# Patient Record
Sex: Male | Born: 1985 | Race: White | Hispanic: No | Marital: Married | Smoking: Current every day smoker
Health system: Southern US, Community
[De-identification: ages and names within clinical notes are randomized; demographics above are authoritative.]

## PROBLEM LIST (undated history)

## (undated) DIAGNOSIS — N200 Calculus of kidney: Secondary | ICD-10-CM

## (undated) HISTORY — PX: OTHER SURGICAL HISTORY: SHX169

---

## 2005-09-08 ENCOUNTER — Emergency Department: Payer: Self-pay | Admitting: Internal Medicine

## 2012-01-09 ENCOUNTER — Emergency Department: Payer: Self-pay | Admitting: Emergency Medicine

## 2012-01-09 LAB — CBC
HGB: 17 g/dL (ref 13.0–18.0)
MCHC: 33.5 g/dL (ref 32.0–36.0)
MCV: 86 fL (ref 80–100)
Platelet: 258 10*3/uL (ref 150–440)
RBC: 5.91 10*6/uL — ABNORMAL HIGH (ref 4.40–5.90)
RDW: 13.4 % (ref 11.5–14.5)
WBC: 7.8 10*3/uL (ref 3.8–10.6)

## 2012-01-09 LAB — COMPREHENSIVE METABOLIC PANEL
Calcium, Total: 9.7 mg/dL (ref 8.5–10.1)
Co2: 28 mmol/L (ref 21–32)
EGFR (African American): >60
EGFR (Non-African Amer.): >60
Glucose: 67 mg/dL (ref 65–99)
SGOT(AST): 12 U/L — ABNORMAL LOW (ref 15–37)
SGPT (ALT): 23 U/L (ref 12–78)

## 2012-01-13 ENCOUNTER — Emergency Department: Payer: Self-pay | Admitting: Emergency Medicine

## 2012-01-13 LAB — BASIC METABOLIC PANEL
Anion Gap: 6 — ABNORMAL LOW (ref 7–16)
BUN: 12 mg/dL (ref 7–18)
Calcium, Total: 8.9 mg/dL (ref 8.5–10.1)
Chloride: 105 mmol/L (ref 98–107)
Creatinine: 1.13 mg/dL (ref 0.60–1.30)
EGFR (African American): >60
Sodium: 138 mmol/L (ref 136–145)

## 2012-01-13 LAB — CBC WITH DIFFERENTIAL/PLATELET
Basophil #: 0.1 10*3/uL (ref 0.0–0.1)
Eosinophil #: 0.4 10*3/uL (ref 0.0–0.7)
HCT: 45.9 % (ref 40.0–52.0)
Lymphocyte %: 32.9 %
MCHC: 33.8 g/dL (ref 32.0–36.0)
Neutrophil %: 54.8 %
Platelet: 248 10*3/uL (ref 150–440)
RBC: 5.34 10*6/uL (ref 4.40–5.90)
RDW: 13.6 % (ref 11.5–14.5)
WBC: 8.2 10*3/uL (ref 3.8–10.6)

## 2012-01-15 LAB — CULTURE, BLOOD (SINGLE)

## 2016-02-22 ENCOUNTER — Encounter: Payer: Self-pay | Admitting: Medical Oncology

## 2016-02-22 ENCOUNTER — Emergency Department: Payer: Self-pay

## 2016-02-22 ENCOUNTER — Emergency Department
Admission: EM | Admit: 2016-02-22 | Discharge: 2016-02-22 | Disposition: A | Discharge status: Home / Self Care | Payer: Self-pay | Attending: Emergency Medicine | Admitting: Emergency Medicine

## 2016-02-22 ENCOUNTER — Other Ambulatory Visit: Payer: Self-pay

## 2016-02-22 DIAGNOSIS — N210 Calculus in bladder: Secondary | ICD-10-CM | POA: Insufficient documentation

## 2016-02-22 DIAGNOSIS — N200 Calculus of kidney: Secondary | ICD-10-CM

## 2016-02-22 LAB — URINALYSIS COMPLETE WITH MICROSCOPIC (ARMC ONLY)
BACTERIA UA: NONE SEEN
Bilirubin Urine: NEGATIVE
Glucose, UA: NEGATIVE mg/dL
Hgb urine dipstick: NEGATIVE
Ketones, ur: NEGATIVE mg/dL
Leukocytes, UA: NEGATIVE
Nitrite: NEGATIVE
PH: 6 (ref 5.0–8.0)
PROTEIN: NEGATIVE mg/dL
Specific Gravity, Urine: 1.015 (ref 1.005–1.030)

## 2016-02-22 LAB — BASIC METABOLIC PANEL
ANION GAP: 9 (ref 5–15)
BUN: 19 mg/dL (ref 6–20)
CHLORIDE: 106 mmol/L (ref 101–111)
CO2: 25 mmol/L (ref 22–32)
Calcium: 10 mg/dL (ref 8.9–10.3)
Creatinine, Ser: 1.21 mg/dL (ref 0.61–1.24)
GFR calc non Af Amer: >60 mL/min (ref >60)
Glucose, Bld: 151 mg/dL — ABNORMAL HIGH (ref 65–99)
Potassium: 4.5 mmol/L (ref 3.5–5.1)
Sodium: 140 mmol/L (ref 135–145)

## 2016-02-22 LAB — CBC
HCT: 53 % — ABNORMAL HIGH (ref 40.0–52.0)
HEMOGLOBIN: 17.7 g/dL (ref 13.0–18.0)
MCH: 28.6 pg (ref 26.0–34.0)
MCHC: 33.5 g/dL (ref 32.0–36.0)
MCV: 85.4 fL (ref 80.0–100.0)
Platelets: 326 10*3/uL (ref 150–440)
RBC: 6.21 MIL/uL — AB (ref 4.40–5.90)
RDW: 13.9 % (ref 11.5–14.5)
WBC: 21.4 10*3/uL — ABNORMAL HIGH (ref 3.8–10.6)

## 2016-02-22 MED ORDER — ONDANSETRON HCL 4 MG/2ML IJ SOLN
INTRAMUSCULAR | Status: AC
  Administered 2016-02-22: 4 mg via INTRAVENOUS
  Filled 2016-02-22: qty 2

## 2016-02-22 MED ORDER — ONDANSETRON HCL 4 MG/2ML IJ SOLN
4.0000 mg | Freq: Once | INTRAMUSCULAR | Status: AC
  Administered 2016-02-22: 4 mg via INTRAVENOUS

## 2016-02-22 MED ORDER — ONDANSETRON 4 MG PO TBDP: 4.0000 mg | ORAL_TABLET | Freq: Three times a day (TID) | ORAL | 0 refills | Status: DC | PRN

## 2016-02-22 MED ORDER — OXYCODONE-ACETAMINOPHEN 5-325 MG PO TABS: 1.0000 | ORAL_TABLET | ORAL | 0 refills | Status: DC | PRN

## 2016-02-22 MED ORDER — KETOROLAC TROMETHAMINE 30 MG/ML IJ SOLN
30.0000 mg | Freq: Once | INTRAMUSCULAR | Status: AC
  Administered 2016-02-22: 30 mg via INTRAVENOUS

## 2016-02-22 MED ORDER — KETOROLAC TROMETHAMINE 30 MG/ML IJ SOLN
INTRAMUSCULAR | Status: AC
  Administered 2016-02-22: 30 mg via INTRAVENOUS
  Filled 2016-02-22: qty 1

## 2016-02-22 MED ORDER — SODIUM CHLORIDE 0.9 % IV BOLUS (SEPSIS)
1000.0000 mL | Freq: Once | INTRAVENOUS | Status: AC
  Administered 2016-02-22: 1000 mL via INTRAVENOUS

## 2016-02-22 MED ORDER — MORPHINE SULFATE (PF) 2 MG/ML IV SOLN
INTRAVENOUS | Status: AC
  Administered 2016-02-22: 4 mg via INTRAVENOUS
  Filled 2016-02-22: qty 2

## 2016-02-22 MED ORDER — MORPHINE SULFATE (PF) 2 MG/ML IV SOLN
4.0000 mg | Freq: Once | INTRAVENOUS | Status: AC
  Administered 2016-02-22: 4 mg via INTRAVENOUS

## 2016-02-22 NOTE — Discharge Instructions (Signed)
You have been seen in the Emergency Department (ED)  Today and was diagnosed with kidney stones. While the stone is traveling through the ureter, which is the tube that carries urine from the kidney to the bladder, you will probably feel pain. The pain may be mild or very severe. You may also have some blood in your urine. As soon as the stone reaches the bladder, any intense pain should go away. If a stone is too large to pass on its own, you may need a medical procedure to help you pass the stone.  ° °As we have discussed, please drink plenty of fluids and use a urinary strainer to attempt to capture the stone.  Please make a follow up appointment with Urology in the next week by calling the number below and bring the stone with you. ° °Take ibuprofen 600mg every 6 hours for the pain. If the pain is not well controlled with ibuprofen you may take one percocet every 4 hours. Do not take tylenol while taking percocet. Check with your doctor if you have a history of gastritis, stomach ulcers, renal failure or impaired kidney function as you may not be able to take ibuprofen/ motrin. Your doctor can give you a different prescription for pain control. ° °Follow-up with your doctor or return to the ER in 12-24 hours if your pain is not well controlled, if you develop pain or burning with urination, or if you develop a fever. Otherwise follow up in 3-5 days with your doctor. ° °When should you call for help?  °Call your doctor now or seek immediate medical care if:  °You cannot keep down fluids.  °Your pain gets worse.  °You have a fever or chills.  °You have new or worse pain in your back just below your rib cage (the flank area).  °You have new or more blood in your urine. °You have pain or burning with urination °You are unable to urinate °You have abdominal pain ° °Watch closely for changes in your health, and be sure to contact your doctor if:  °You do not get better as expected ° °How can you care for yourself at  home?  °Drink plenty of fluids, enough so that your urine is light yellow or clear like water. If you have kidney, heart, or liver disease and have to limit fluids, talk with your doctor before you increase the amount of fluids you drink.  °Take pain medicines exactly as directed. Call your doctor if you think you are having a problem with your medicine.  °If the doctor gave you a prescription medicine for pain, take it as prescribed.  °If you are not taking a prescription pain medicine, ask your doctor if you can take an over-the-counter medicine. Read and follow all instructions on the label. °Your doctor may ask you to strain your urine so that you can collect your kidney stone when it passes. You can use a kitchen strainer or a tea strainer to catch the stone. Store it in a plastic bag until you see your doctor again. ° °Preventing future kidney stones  °Some changes in your diet may help prevent kidney stones. Depending on the cause of your stones, your doctor may recommend that you:  °Drink plenty of fluids, enough so that your urine is light yellow or clear like water. If you have kidney, heart, or liver disease and have to limit fluids, talk with your doctor before you increase the amount of fluids you drink.  °  Limit coffee, tea, and alcohol. Also avoid grapefruit juice.  Do not take more than the recommended daily dose of vitamins C and D.  Avoid antacids such as Gaviscon, Maalox, Mylanta, or Tums.  Limit the amount of salt (sodium) in your diet.  Eat a balanced diet that is not too high in protein.  Limit foods that are high in a substance called oxalate, which can cause kidney stones. These foods include dark green vegetables, rhubarb, chocolate, wheat bran, nuts, cranberries, and beans.

## 2016-02-22 NOTE — ED Provider Notes (Signed)
[facility] Emergency Department Provider Note  ____________________________________________  Time seen: Approximately 11:23 AM  I have reviewed the triage vital signs and the nursing notes.   HISTORY  Chief Complaint Flank Pain and Abdominal Pain   HPI Ray Robbins is a 30 y.o. male no significant past medical history who presents for evaluation of sudden onset of right flank pain. Patient reports pain woke him up from sleep at 3 AM. The pain is severe, colicky, radiating to his right groin. Patient has had a few episodes of nonbloody nonbilious emesis. No prior history of kidney stones, no hematuria, no dysuria, no chills, no fever, no abdominal pain, no testicular pain. No prior abdominal surgeries. Patient has not tried anything at home for the pain. Pain is 8 out of 10.  History reviewed. No pertinent past medical history.  There are no active problems to display for this patient.   History reviewed. No pertinent surgical history.  Prior to Admission medications   Medication Sig Start Date End Date Taking? Authorizing Provider  ondansetron (ZOFRAN ODT) 4 MG disintegrating tablet Take 1 tablet (4 mg total) by mouth every 8 (eight) hours as needed for nausea or vomiting. 02/22/16   Nita Sickle, MD  oxyCODONE-acetaminophen (ROXICET) 5-325 MG tablet Take 1 tablet by mouth every 4 (four) hours as needed for severe pain. 02/22/16   Nita Sickle, MD    Allergies Review of patient's allergies indicates no known allergies.  No family history on file.  Social History Social History  Substance Use Topics  . Smoking status: Not on file  . Smokeless tobacco: Not on file  . Alcohol use Not on file    Review of Systems  Constitutional: Negative for fever. Eyes: Negative for visual changes. ENT: Negative for sore throat. Cardiovascular: Negative for chest pain. Respiratory: Negative for shortness of breath. Gastrointestinal: Negative for  abdominal pain, vomiting or diarrhea. Genitourinary: Negative for dysuria. + R flank pain Musculoskeletal: Negative for back pain. Skin: Negative for rash. Neurological: Negative for headaches, weakness or numbness.  ____________________________________________   PHYSICAL EXAM:  VITAL SIGNS: ED Triage Vitals [02/22/16 0936]  Enc Vitals Group     BP (!) 154/94     Pulse Rate (!) 52     Resp 20     Temp 97.6 F (36.4 C)     Temp Source Oral     SpO2 97 %     Weight 220 lb (99.8 kg)     Height 5\' 8"  (1.727 m)     Head Circumference      Peak Flow      Pain Score 10     Pain Loc      Pain Edu?      Excl. in GC?     Constitutional: Alert and oriented. Well appearing and in no apparent distress. HEENT:      Head: Normocephalic and atraumatic.         Eyes: Conjunctivae are normal. Sclera is non-icteric. EOMI. PERRL      Mouth/Throat: Mucous membranes are moist.       Neck: Supple with no signs of meningismus. Cardiovascular: Regular rate and rhythm. No murmurs, gallops, or rubs. 2+ symmetrical distal pulses are present in all extremities. No JVD. Respiratory: Normal respiratory effort. Lungs are clear to auscultation bilaterally. No wheezes, crackles, or rhonchi.  Gastrointestinal: Soft, non tender, and non distended with positive bowel sounds. No rebound or guarding. Genitourinary: R CVA tenderness. Bilateral descended testicles, positive bilateral  cremasteric reflex, no testicular tenderness, no swelling or erythema of the scrotum. Musculoskeletal: Nontender with normal range of motion in all extremities. No edema, cyanosis, or erythema of extremities. Neurologic: Normal speech and language. Face is symmetric. Moving all extremities. No gross focal neurologic deficits are appreciated. Skin: Skin is warm, dry and intact. No rash noted. Psychiatric: Mood and affect are normal. Speech and behavior are normal.  ____________________________________________   LABS (all labs  ordered are listed, but only abnormal results are displayed)  Labs Reviewed  BASIC METABOLIC PANEL - Abnormal; Notable for the following:       Result Value   Glucose, Bld 151 (*)    All other components within normal limits  CBC - Abnormal; Notable for the following:    WBC 21.4 (*)    RBC 6.21 (*)    HCT 53.0 (*)    All other components within normal limits  URINALYSIS COMPLETEWITH MICROSCOPIC ([facility] ONLY) - Abnormal; Notable for the following:    Color, Urine YELLOW (*)    APPearance CLEAR (*)    Squamous Epithelial / LPF 0-5 (*)    All other components within normal limits   ____________________________________________  EKG  none ____________________________________________  RADIOLOGY  CT renal: Mild bilateral nephrolithiasis. 1-2 mm stone at the right UVJ just inside the bladder causing low-grade obstruction. ____________________________________________   PROCEDURES  Procedure(s) performed: None Procedures Critical Care performed:  None ____________________________________________   INITIAL IMPRESSION / ASSESSMENT AND PLAN / ED COURSE  30 y.o. male no significant past medical history who presents for evaluation of sudden onset of right flank pain. No urinary symptoms or prior h/o kidney stones. Patient is now well appearing after IV morphine and toradol. Will get basic labs, UA, CT renal. NO abdominal ttp.   Clinical Course  Comment By Time  Patient reports that cramping colicky pain has now resolved. He has mild soreness in his right flank. CT scan concerning for a right UVJ stone. UA with no evidence of infection. We'll discharge patient home with pain medications and follow-up with urology. Discussed return precautions with patient. Nita Sicklearolina Gunther Zawadzki, MD 10/27 1256    Pertinent labs & imaging results that were available during my care of the patient were reviewed by me and considered in my medical decision making (see chart for  details).    ____________________________________________   FINAL CLINICAL IMPRESSION(S) / ED DIAGNOSES  Final diagnoses:  Kidney stone      NEW MEDICATIONS STARTED DURING THIS VISIT:  New Prescriptions   ONDANSETRON (ZOFRAN ODT) 4 MG DISINTEGRATING TABLET    Take 1 tablet (4 mg total) by mouth every 8 (eight) hours as needed for nausea or vomiting.   OXYCODONE-ACETAMINOPHEN (ROXICET) 5-325 MG TABLET    Take 1 tablet by mouth every 4 (four) hours as needed for severe pain.     Note:  This document was prepared using Dragon voice recognition software and may include unintentional dictation errors.    Nita Sicklearolina Brenna Friesenhahn, MD 02/22/16 [phone]

## 2016-02-22 NOTE — ED Triage Notes (Signed)
Pt reports that he began having rt lower abd pain that radiates into rt flank. Pt diaphoretic in triage and can not sit still.

## 2019-05-13 ENCOUNTER — Encounter: Payer: Self-pay | Admitting: Intensive Care

## 2019-05-13 ENCOUNTER — Emergency Department: Payer: Self-pay

## 2019-05-13 ENCOUNTER — Other Ambulatory Visit: Payer: Self-pay

## 2019-05-13 ENCOUNTER — Emergency Department
Admission: EM | Admit: 2019-05-13 | Discharge: 2019-05-13 | Disposition: A | Discharge status: Home / Self Care | Payer: Self-pay | Attending: Emergency Medicine | Admitting: Emergency Medicine

## 2019-05-13 DIAGNOSIS — F17210 Nicotine dependence, cigarettes, uncomplicated: Secondary | ICD-10-CM | POA: Insufficient documentation

## 2019-05-13 DIAGNOSIS — N200 Calculus of kidney: Secondary | ICD-10-CM | POA: Insufficient documentation

## 2019-05-13 DIAGNOSIS — Z87442 Personal history of urinary calculi: Secondary | ICD-10-CM | POA: Insufficient documentation

## 2019-05-13 HISTORY — DX: Calculus of kidney: N20.0

## 2019-05-13 LAB — BASIC METABOLIC PANEL
Anion gap: 11 (ref 5–15)
BUN: 15 mg/dL (ref 6–20)
CO2: 22 mmol/L (ref 22–32)
Calcium: 9.9 mg/dL (ref 8.9–10.3)
Chloride: 106 mmol/L (ref 98–111)
Creatinine, Ser: 1.75 mg/dL — ABNORMAL HIGH (ref 0.61–1.24)
GFR calc Af Amer: 58 mL/min — ABNORMAL LOW (ref >60)
GFR calc non Af Amer: 50 mL/min — ABNORMAL LOW (ref >60)
Glucose, Bld: 130 mg/dL — ABNORMAL HIGH (ref 70–99)
Potassium: 4.2 mmol/L (ref 3.5–5.1)
Sodium: 139 mmol/L (ref 135–145)

## 2019-05-13 LAB — URINALYSIS, COMPLETE (UACMP) WITH MICROSCOPIC
Bilirubin Urine: NEGATIVE
Glucose, UA: NEGATIVE mg/dL
Ketones, ur: 20 mg/dL — AB
Leukocytes,Ua: NEGATIVE
Nitrite: NEGATIVE
Protein, ur: 100 mg/dL — AB
RBC / HPF: >50 RBC/hpf — ABNORMAL HIGH (ref 0–5)
Specific Gravity, Urine: 1.031 — ABNORMAL HIGH (ref 1.005–1.030)
pH: 7 (ref 5.0–8.0)

## 2019-05-13 LAB — CBC
HCT: 52.1 % — ABNORMAL HIGH (ref 39.0–52.0)
Hemoglobin: 17.9 g/dL — ABNORMAL HIGH (ref 13.0–17.0)
MCH: 28.5 pg (ref 26.0–34.0)
MCHC: 34.4 g/dL (ref 30.0–36.0)
MCV: 82.8 fL (ref 80.0–100.0)
Platelets: 344 10*3/uL (ref 150–400)
RBC: 6.29 MIL/uL — ABNORMAL HIGH (ref 4.22–5.81)
RDW: 13.4 % (ref 11.5–15.5)
WBC: 21.7 10*3/uL — ABNORMAL HIGH (ref 4.0–10.5)
nRBC: 0 % (ref 0.0–0.2)

## 2019-05-13 LAB — LACTIC ACID, PLASMA: Lactic Acid, Venous: 1.3 mmol/L (ref 0.5–1.9)

## 2019-05-13 MED ORDER — OXYCODONE-ACETAMINOPHEN 5-325 MG PO TABS: 1.0000 | ORAL_TABLET | Freq: Three times a day (TID) | ORAL | 0 refills | Status: AC | PRN

## 2019-05-13 MED ORDER — SODIUM CHLORIDE 0.9 % IV SOLN
2.0000 g | Freq: Once | INTRAVENOUS | Status: AC
  Administered 2019-05-13: 2 g via INTRAVENOUS
  Filled 2019-05-13: qty 20

## 2019-05-13 MED ORDER — OXYCODONE-ACETAMINOPHEN 5-325 MG PO TABS
1.0000 | ORAL_TABLET | Freq: Once | ORAL | Status: AC
  Administered 2019-05-13: 1 via ORAL
  Filled 2019-05-13: qty 1

## 2019-05-13 MED ORDER — ONDANSETRON HCL 4 MG PO TABS: 4.0000 mg | ORAL_TABLET | Freq: Three times a day (TID) | ORAL | 0 refills | Status: AC | PRN

## 2019-05-13 MED ORDER — ONDANSETRON HCL 4 MG/2ML IJ SOLN
4.0000 mg | Freq: Once | INTRAMUSCULAR | Status: AC
Start: 2019-05-13 — End: 2019-05-13
  Administered 2019-05-13: 4 mg via INTRAVENOUS
  Filled 2019-05-13: qty 2

## 2019-05-13 MED ORDER — OXYCODONE-ACETAMINOPHEN 5-325 MG PO TABS
1.0000 | ORAL_TABLET | ORAL | Status: DC | PRN
  Administered 2019-05-13: 1 via ORAL
  Filled 2019-05-13: qty 1

## 2019-05-13 MED ORDER — CEPHALEXIN 500 MG PO CAPS: 500.0000 mg | ORAL_CAPSULE | Freq: Three times a day (TID) | ORAL | 0 refills | Status: DC

## 2019-05-13 MED ORDER — ONDANSETRON 4 MG PO TBDP
4.0000 mg | ORAL_TABLET | Freq: Once | ORAL | Status: AC | PRN
  Administered 2019-05-13: 4 mg via ORAL
  Filled 2019-05-13: qty 1

## 2019-05-13 NOTE — ED Provider Notes (Signed)
Emergency Department Provider Note  ____________________________________________  Time seen: Approximately 7:27 PM  I have reviewed the triage vital signs and the nursing notes.   HISTORY  Chief Complaint Flank Pain   Historian Patient     HPI Ray Robbins is a 34 y.o. male with a history of nephrolithiasis, presents to the emergency department with left-sided flank pain, nausea and vomiting for the past 2 days.  Patient has also had some mild dysuria and decreased urine output.  Patient reports that his last kidney stone occurred 3 years ago.  He denies fever or chills at home.  No suprapubic pain. No other alleviating measures have been attempted.    Past Medical History:  Diagnosis Date  . Kidney stones      Immunizations up to date:  Yes.     Past Medical History:  Diagnosis Date  . Kidney stones     There are no problems to display for this patient.   History reviewed. No pertinent surgical history.  Prior to Admission medications   Medication Sig Start Date End Date Taking? Authorizing Provider  cephALEXin (KEFLEX) 500 MG capsule Take 1 capsule (500 mg total) by mouth 3 (three) times daily for 7 days. 05/13/19 05/20/19  Orvil Feil, PA-C  ondansetron (ZOFRAN ODT) 4 MG disintegrating tablet Take 1 tablet (4 mg total) by mouth every 8 (eight) hours as needed for nausea or vomiting. 02/22/16   Nita Sickle, MD  ondansetron (ZOFRAN) 4 MG tablet Take 1 tablet (4 mg total) by mouth every 8 (eight) hours as needed for up to 5 days for nausea or vomiting. 05/13/19 05/18/19  Orvil Feil, PA-C  oxyCODONE-acetaminophen (PERCOCET/ROXICET) 5-325 MG tablet Take 1 tablet by mouth every 8 (eight) hours as needed for up to 5 days. 05/13/19 05/18/19  Orvil Feil, PA-C    Allergies Patient has no known allergies.  History reviewed. No pertinent family history.  Social History Social History   Tobacco Use  . Smoking status: Current Every Day Smoker     Types: Cigarettes  . Smokeless tobacco: Never Used  Substance Use Topics  . Alcohol use: Yes    Comment: occ  . Drug use: Yes    Types: Marijuana     Review of Systems  Constitutional: No fever/chills Eyes:  No discharge ENT: No upper respiratory complaints. Respiratory: no cough. No SOB/ use of accessory muscles to breath Gastrointestinal:   No nausea, no vomiting.  No diarrhea.  No constipation.  Genitourinary: Patient has left sided flank pain.  Musculoskeletal: Negative for musculoskeletal pain. Skin: Negative for rash, abrasions, lacerations, ecchymosis.    ____________________________________________   PHYSICAL EXAM:  VITAL SIGNS: ED Triage Vitals [05/13/19 1706]  Enc Vitals Group     BP (!) 150/85     Pulse Rate 68     Resp 16     Temp 98.4 F (36.9 C)     Temp Source Oral     SpO2 99 %     Weight 220 lb (99.8 kg)     Height 5\' 7"  (1.702 m)     Head Circumference      Peak Flow      Pain Score 7     Pain Loc      Pain Edu?      Excl. in GC?      Constitutional: Alert and oriented. Well appearing and in no acute distress. Eyes: Conjunctivae are normal. PERRL. EOMI. Head: Atraumatic. Cardiovascular: Normal rate, regular rhythm.  Normal S1 and S2.  Good peripheral circulation. Respiratory: Normal respiratory effort without tachypnea or retractions. Lungs CTAB. Good air entry to the bases with no decreased or absent breath sounds Gastrointestinal: Bowel sounds x 4 quadrants. Soft and nontender to palpation. No guarding or rigidity. No distention. Genitourinary: Patient has left sided CVA tenderness.  Musculoskeletal: Full range of motion to all extremities. No obvious deformities noted Neurologic:  Normal for age. No gross focal neurologic deficits are appreciated.  Skin:  Skin is warm, dry and intact. No rash noted. Psychiatric: Mood and affect are normal for age. Speech and behavior are normal.   ____________________________________________    LABS (all labs ordered are listed, but only abnormal results are displayed)  Labs Reviewed  URINALYSIS, COMPLETE (UACMP) WITH MICROSCOPIC - Abnormal; Notable for the following components:      Result Value   Color, Urine AMBER (*)    APPearance CLOUDY (*)    Specific Gravity, Urine 1.031 (*)    Hgb urine dipstick MODERATE (*)    Ketones, ur 20 (*)    Protein, ur 100 (*)    RBC / HPF >50 (*)    Bacteria, UA RARE (*)    All other components within normal limits  BASIC METABOLIC PANEL - Abnormal; Notable for the following components:   Glucose, Bld 130 (*)    Creatinine, Ser 1.75 (*)    GFR calc non Af Amer 50 (*)    GFR calc Af Amer 58 (*)    All other components within normal limits  CBC - Abnormal; Notable for the following components:   WBC 21.7 (*)    RBC 6.29 (*)    Hemoglobin 17.9 (*)    HCT 52.1 (*)    All other components within normal limits  URINE CULTURE  CULTURE, BLOOD (ROUTINE X 2)  CULTURE, BLOOD (ROUTINE X 2)  LACTIC ACID, PLASMA  LACTIC ACID, PLASMA   ____________________________________________  EKG   ____________________________________________  RADIOLOGY Unk Pinto, personally viewed and evaluated these images (plain radiographs) as part of my medical decision making, as well as reviewing the written report by the radiologist.  CT Renal Stone Study  Result Date: 05/13/2019 CLINICAL DATA:  34 year old male with left flank pain. EXAM: CT ABDOMEN AND PELVIS WITHOUT CONTRAST TECHNIQUE: Multidetector CT imaging of the abdomen and pelvis was performed following the standard protocol without IV contrast. COMPARISON:  CT dated 02/22/2016. FINDINGS: Evaluation of this exam is limited in the absence of intravenous contrast. Lower chest: The visualized lung bases are clear. No intra-abdominal free air or free fluid. Hepatobiliary: Diffuse fatty infiltration of the liver. No intrahepatic biliary ductal dilatation. The gallbladder is unremarkable. Pancreas:  Unremarkable. No pancreatic ductal dilatation or surrounding inflammatory changes. Spleen: Normal in size without focal abnormality. Adrenals/Urinary Tract: The adrenal glands are unremarkable. There is an 8 mm stone in the proximal left ureter with mild left hydronephrosis. There is mild stranding of the left perinephric fat. Correlation with urinalysis recommended to exclude superimposed UTI. The right kidney, right ureter, and urinary bladder appear unremarkable. Stomach/Bowel: There is no bowel obstruction or active inflammation. The appendix is normal. Vascular/Lymphatic: The abdominal aorta and IVC are unremarkable. No portal venous gas. There is no adenopathy. Reproductive: The prostate and seminal vesicles are grossly unremarkable. No pelvic mass Other: None Musculoskeletal: No acute or significant osseous findings. IMPRESSION: An 8 mm left proximal ureteral calculus with mild left hydronephrosis. Correlation with urinalysis recommended to exclude superimposed UTI. Electronically Signed  By: Elgie Collard M.D.   On: 05/13/2019 18:19    ____________________________________________    PROCEDURES  Procedure(s) performed:     Procedures     Medications  oxyCODONE-acetaminophen (PERCOCET/ROXICET) 5-325 MG per tablet 1 tablet (1 tablet Oral Given 05/13/19 1712)  oxyCODONE-acetaminophen (PERCOCET/ROXICET) 5-325 MG per tablet 1 tablet (has no administration in time range)  ondansetron (ZOFRAN-ODT) disintegrating tablet 4 mg (4 mg Oral Given 05/13/19 1711)  cefTRIAXone (ROCEPHIN) 2 g in sodium chloride 0.9 % 100 mL IVPB (2 g Intravenous New Bag/Given 05/13/19 1824)  ondansetron (ZOFRAN) injection 4 mg (4 mg Intravenous Given 05/13/19 1850)     ____________________________________________   INITIAL IMPRESSION / ASSESSMENT AND PLAN / ED COURSE  Pertinent labs & imaging results that were available during my care of the patient were reviewed by me and considered in my medical decision  making (see chart for details).  Clinical Course as of May 12 1925  Caleen Essex May 13, 2019  1745 Anion gap: 11 [JW]  1911 Lactic acid, plasma [JW]    Clinical Course User Index [JW] Orvil Feil, PA-C     Assessment and Plan: Nephrolithiasis 34 year old male presents to the emergency department with left-sided flank pain for the past 2 to 3 days.  BMP was concerning for elevated creatinine which had trended up from patient's last emergency department encounter.  Significant leukocytosis was identified on CBC.  CT renal stone study revealed an 8 mm left-sided proximal renal stone.    Urologist on-call, Dr. Alvester Morin was consulted.  Specifically, patient's history and labs were discussed and Dr. Alvester Morin recommended outpatient follow-up.  Urine culture and blood cultures are pending at this time.  Patient was given 2 g of Rocephin in the emergency department and he was discharged with Keflex.  He was sent home with Roxicet for pain and Zofran.  Strict return precautions were given to return to the emergency department with fever, chills or worsening back pain.  He voiced understanding and has easy access to the ED should symptoms change.    ____________________________________________  FINAL CLINICAL IMPRESSION(S) / ED DIAGNOSES  Final diagnoses:  Nephrolithiasis      NEW MEDICATIONS STARTED DURING THIS VISIT:  ED Discharge Orders         Ordered    cephALEXin (KEFLEX) 500 MG capsule  3 times daily     05/13/19 1917    ondansetron (ZOFRAN) 4 MG tablet  Every 8 hours PRN     05/13/19 1917    oxyCODONE-acetaminophen (PERCOCET/ROXICET) 5-325 MG tablet  Every 8 hours PRN     05/13/19 1917              This chart was dictated using voice recognition software/Dragon. Despite best efforts to proofread, errors can occur which can change the meaning. Any change was purely unintentional.     Orvil Feil, PA-C 05/13/19 2309    Concha Se, MD 05/15/19 [phone]

## 2019-05-13 NOTE — ED Notes (Signed)
Reviewed discharge instructions, follow-up care, and prescriptions with patient. Patient verbalized understanding of all information reviewed. Patient stable, with no distress noted at this time.    

## 2019-05-13 NOTE — ED Triage Notes (Signed)
C/o left flank pain X2 days. Burning during urination and frequent with little results.

## 2019-05-13 NOTE — Discharge Instructions (Addendum)
Return to the emergency department with worsening fever or chills. Take Keflex 3 times daily for the next week. Please make appointment with Dr. Alvester Morin as soon as possible. Percocet can be taken for pain.

## 2019-05-15 ENCOUNTER — Emergency Department
Admission: EM | Admit: 2019-05-15 | Discharge: 2019-05-15 | Disposition: A | Discharge status: Home / Self Care | Payer: Self-pay | Attending: Emergency Medicine | Admitting: Emergency Medicine

## 2019-05-15 ENCOUNTER — Other Ambulatory Visit: Payer: Self-pay

## 2019-05-15 DIAGNOSIS — N23 Unspecified renal colic: Secondary | ICD-10-CM

## 2019-05-15 DIAGNOSIS — F17210 Nicotine dependence, cigarettes, uncomplicated: Secondary | ICD-10-CM | POA: Insufficient documentation

## 2019-05-15 DIAGNOSIS — F1210 Cannabis abuse, uncomplicated: Secondary | ICD-10-CM | POA: Insufficient documentation

## 2019-05-15 DIAGNOSIS — Z79899 Other long term (current) drug therapy: Secondary | ICD-10-CM | POA: Insufficient documentation

## 2019-05-15 LAB — COMPREHENSIVE METABOLIC PANEL
ALT: 17 U/L (ref 0–44)
AST: 16 U/L (ref 15–41)
Albumin: 4.9 g/dL (ref 3.5–5.0)
Alkaline Phosphatase: 88 U/L (ref 38–126)
Anion gap: 17 — ABNORMAL HIGH (ref 5–15)
BUN: 25 mg/dL — ABNORMAL HIGH (ref 6–20)
CO2: 15 mmol/L — ABNORMAL LOW (ref 22–32)
Calcium: 9.7 mg/dL (ref 8.9–10.3)
Chloride: 104 mmol/L (ref 98–111)
Creatinine, Ser: 1.79 mg/dL — ABNORMAL HIGH (ref 0.61–1.24)
GFR calc Af Amer: 56 mL/min — ABNORMAL LOW (ref >60)
GFR calc non Af Amer: 49 mL/min — ABNORMAL LOW (ref >60)
Glucose, Bld: 126 mg/dL — ABNORMAL HIGH (ref 70–99)
Potassium: 3.7 mmol/L (ref 3.5–5.1)
Sodium: 136 mmol/L (ref 135–145)
Total Bilirubin: 1.6 mg/dL — ABNORMAL HIGH (ref 0.3–1.2)
Total Protein: 8.1 g/dL (ref 6.5–8.1)

## 2019-05-15 LAB — CBC
HCT: 52 % (ref 39.0–52.0)
Hemoglobin: 17.9 g/dL — ABNORMAL HIGH (ref 13.0–17.0)
MCH: 28.4 pg (ref 26.0–34.0)
MCHC: 34.4 g/dL (ref 30.0–36.0)
MCV: 82.4 fL (ref 80.0–100.0)
Platelets: 350 10*3/uL (ref 150–400)
RBC: 6.31 MIL/uL — ABNORMAL HIGH (ref 4.22–5.81)
RDW: 13.2 % (ref 11.5–15.5)
WBC: 17.2 10*3/uL — ABNORMAL HIGH (ref 4.0–10.5)
nRBC: 0 % (ref 0.0–0.2)

## 2019-05-15 LAB — URINALYSIS, COMPLETE (UACMP) WITH MICROSCOPIC
Bilirubin Urine: NEGATIVE
Glucose, UA: NEGATIVE mg/dL
Ketones, ur: 20 mg/dL — AB
Leukocytes,Ua: NEGATIVE
Nitrite: NEGATIVE
Protein, ur: 100 mg/dL — AB
RBC / HPF: >50 RBC/hpf — ABNORMAL HIGH (ref 0–5)
Specific Gravity, Urine: 1.04 — ABNORMAL HIGH (ref 1.005–1.030)
pH: 6 (ref 5.0–8.0)

## 2019-05-15 LAB — URINE CULTURE
Culture: <10000 — AB
Special Requests: NORMAL

## 2019-05-15 LAB — LIPASE, BLOOD: Lipase: 15 U/L (ref 11–51)

## 2019-05-15 MED ORDER — OXYCODONE HCL 5 MG PO TABS
5.0000 mg | ORAL_TABLET | Freq: Once | ORAL | Status: AC
  Administered 2019-05-15: 5 mg via ORAL
  Filled 2019-05-15: qty 1

## 2019-05-15 MED ORDER — KETOROLAC TROMETHAMINE 30 MG/ML IJ SOLN
15.0000 mg | Freq: Once | INTRAMUSCULAR | Status: AC
  Administered 2019-05-15: 15 mg via INTRAVENOUS
  Filled 2019-05-15: qty 1

## 2019-05-15 MED ORDER — SODIUM CHLORIDE 0.9 % IV BOLUS
1000.0000 mL | Freq: Once | INTRAVENOUS | Status: AC
  Administered 2019-05-15: 1000 mL via INTRAVENOUS

## 2019-05-15 MED ORDER — TAMSULOSIN HCL 0.4 MG PO CAPS: 0.4000 mg | ORAL_CAPSULE | Freq: Every day | ORAL | 0 refills | Status: DC

## 2019-05-15 MED ORDER — ACETAMINOPHEN 500 MG PO TABS
1000.0000 mg | ORAL_TABLET | Freq: Once | ORAL | Status: AC
  Administered 2019-05-15: 1000 mg via ORAL
  Filled 2019-05-15: qty 2

## 2019-05-15 MED ORDER — ONDANSETRON HCL 4 MG/2ML IJ SOLN
4.0000 mg | Freq: Once | INTRAMUSCULAR | Status: AC
  Administered 2019-05-15: 04:00:00 4 mg via INTRAVENOUS
  Filled 2019-05-15: qty 2

## 2019-05-15 MED ORDER — MORPHINE SULFATE (PF) 4 MG/ML IV SOLN
4.0000 mg | Freq: Once | INTRAVENOUS | Status: AC
  Administered 2019-05-15: 4 mg via INTRAVENOUS
  Filled 2019-05-15: qty 1

## 2019-05-15 MED ORDER — FENTANYL CITRATE (PF) 100 MCG/2ML IJ SOLN
50.0000 ug | Freq: Once | INTRAMUSCULAR | Status: AC
  Administered 2019-05-15: 50 ug via INTRAVENOUS
  Filled 2019-05-15: qty 2

## 2019-05-15 MED ORDER — TAMSULOSIN HCL 0.4 MG PO CAPS
0.4000 mg | ORAL_CAPSULE | Freq: Once | ORAL | Status: AC
  Administered 2019-05-15: 0.4 mg via ORAL
  Filled 2019-05-15: qty 1

## 2019-05-15 MED ORDER — ONDANSETRON HCL 4 MG/2ML IJ SOLN
4.0000 mg | Freq: Once | INTRAMUSCULAR | Status: AC
  Administered 2019-05-15: 4 mg via INTRAVENOUS
  Filled 2019-05-15: qty 2

## 2019-05-15 NOTE — ED Triage Notes (Signed)
Pt states he has not urinated since he was in the hospital a few days ago.

## 2019-05-15 NOTE — Discharge Instructions (Addendum)

## 2019-05-15 NOTE — ED Provider Notes (Signed)
[facility] Emergency Department Provider Note  ____________________________________________  Time seen: Approximately 5:18 AM  I have reviewed the triage vital signs and the nursing notes.   HISTORY  Chief Complaint Urinary Retention and Emesis   HPI Ray Robbins is a 34 y.o. male who presents for evaluation of abdominal pain, nausea and vomiting.  Patient was seen here 2 days ago and diagnosed with a 8 mm kidney stone.  Patient has been taking oxycodone at home.  Is complaining of severe constant pain and several episodes of nonbloody nonbilious emesis.  Has been unable to keep his medication down.  No dysuria, hematuria, or fever.   Past Medical History:  Diagnosis Date  . Kidney stones     Prior to Admission medications   Medication Sig Start Date End Date Taking? Authorizing Provider  cephALEXin (KEFLEX) 500 MG capsule Take 1 capsule (500 mg total) by mouth 3 (three) times daily for 7 days. 05/13/19 05/20/19  Lannie Fields, PA-C  ondansetron (ZOFRAN ODT) 4 MG disintegrating tablet Take 1 tablet (4 mg total) by mouth every 8 (eight) hours as needed for nausea or vomiting. 02/22/16   Rudene Re, MD  ondansetron (ZOFRAN) 4 MG tablet Take 1 tablet (4 mg total) by mouth every 8 (eight) hours as needed for up to 5 days for nausea or vomiting. 05/13/19 05/18/19  Lannie Fields, PA-C  oxyCODONE-acetaminophen (PERCOCET/ROXICET) 5-325 MG tablet Take 1 tablet by mouth every 8 (eight) hours as needed for up to 5 days. 05/13/19 05/18/19  Lannie Fields, PA-C  tamsulosin (FLOMAX) 0.4 MG CAPS capsule Take 1 capsule (0.4 mg total) by mouth daily for 7 days. 05/15/19 05/22/19  Rudene Re, MD    Allergies Patient has no known allergies.  No family history on file.  Social History Social History   Tobacco Use  . Smoking status: Current Every Day Smoker    Types: Cigarettes  . Smokeless tobacco: Never Used  Substance Use Topics  . Alcohol use:  Yes    Comment: occ  . Drug use: Yes    Types: Marijuana    Review of Systems  Constitutional: Negative for fever. Eyes: Negative for visual changes. ENT: Negative for sore throat. Neck: No neck pain  Cardiovascular: Negative for chest pain. Respiratory: Negative for shortness of breath. Gastrointestinal: Negative for abdominal pain or diarrhea. + N/V Genitourinary: Negative for dysuria. + L flank pain Musculoskeletal: Negative for back pain. Skin: Negative for rash. Neurological: Negative for headaches, weakness or numbness. Psych: No SI or HI  ____________________________________________   PHYSICAL EXAM:  VITAL SIGNS: ED Triage Vitals  Enc Vitals Group     BP 05/15/19 0205 (!) 149/102     Pulse Rate 05/15/19 0205 (!) 109     Resp 05/15/19 0205 18     Temp 05/15/19 0205 98.2 F (36.8 C)     Temp Source 05/15/19 0205 Oral     SpO2 05/15/19 0205 96 %     Weight 05/15/19 0206 220 lb (99.8 kg)     Height 05/15/19 0206 5\' 7"  (1.702 m)     Head Circumference --      Peak Flow --      Pain Score 05/15/19 0205 6     Pain Loc --      Pain Edu? --      Excl. in [place]? --     Constitutional: Alert and oriented. Well appearing and in no apparent distress. HEENT:  Head: Normocephalic and atraumatic.         Eyes: Conjunctivae are normal. Sclera is non-icteric.       Mouth/Throat: Mucous membranes are dry.       Neck: Supple with no signs of meningismus. Cardiovascular: Tachycardic with regular rhythm. No murmurs, gallops, or rubs. 2+ symmetrical distal pulses are present in all extremities. No JVD. Respiratory: Normal respiratory effort. Lungs are clear to auscultation bilaterally. No wheezes, crackles, or rhonchi.  Gastrointestinal: Soft, non tender, and non distended with positive bowel sounds. No rebound or guarding. Genitourinary: No CVA tenderness. Musculoskeletal: Nontender with normal range of motion in all extremities. No edema, cyanosis, or erythema of  extremities. Neurologic: Normal speech and language. Face is symmetric. Moving all extremities. No gross focal neurologic deficits are appreciated. Skin: Skin is warm, dry and intact. No rash noted. Psychiatric: Mood and affect are normal. Speech and behavior are normal.  ____________________________________________   LABS (all labs ordered are listed, but only abnormal results are displayed)  Labs Reviewed  COMPREHENSIVE METABOLIC PANEL - Abnormal; Notable for the following components:      Result Value   CO2 15 (*)    Glucose, Bld 126 (*)    BUN 25 (*)    Creatinine, Ser 1.79 (*)    Total Bilirubin 1.6 (*)    GFR calc non Af Amer 49 (*)    GFR calc Af Amer 56 (*)    Anion gap 17 (*)    All other components within normal limits  CBC - Abnormal; Notable for the following components:   WBC 17.2 (*)    RBC 6.31 (*)    Hemoglobin 17.9 (*)    All other components within normal limits  URINALYSIS, COMPLETE (UACMP) WITH MICROSCOPIC - Abnormal; Notable for the following components:   Color, Urine AMBER (*)    APPearance HAZY (*)    Specific Gravity, Urine 1.040 (*)    Hgb urine dipstick MODERATE (*)    Ketones, ur 20 (*)    Protein, ur 100 (*)    RBC / HPF >50 (*)    Bacteria, UA RARE (*)    All other components within normal limits  URINE CULTURE  LIPASE, BLOOD   ____________________________________________  EKG  none  ____________________________________________  RADIOLOGY  none  ____________________________________________   PROCEDURES  Procedure(s) performed: None Procedures Critical Care performed:  None ____________________________________________   INITIAL IMPRESSION / ASSESSMENT AND PLAN / ED COURSE  34 y.o. male who presents for evaluation of abdominal pain, nausea and vomiting after being diagnosed with a millimeter kidney stone 2 days ago.  Patient looks dry and uncomfortable on exam.  Labs showing no evidence of acute kidney injury.  UA negative for  UTI.  Bladder scan with no signs of urinary retention.  Patient was given IV fluids, Flomax, Toradol, morphine and Zofran.  He feels markedly improved.  Pain is down to 3.  Tolerated p.o.  Will discharge home on ibuprofen, oxycodone, Zofran and Flomax.  Recommended close follow-up with urology since patient is most likely not going to pass the stone on his own.  Discussed my standard return precautions.       As part of my medical decision making, I reviewed the following data within the electronic MEDICAL RECORD NUMBER Nursing notes reviewed and incorporated, Labs reviewed , Old chart reviewed, Notes from prior ED visits and [place] Controlled Substance Database   Please note:  Patient was evaluated in Emergency Department today for the symptoms described in  the history of present illness. Patient was evaluated in the context of the global COVID-19 pandemic, which necessitated consideration that the patient might be at risk for infection with the SARS-CoV-2 virus that causes COVID-19. Institutional protocols and algorithms that pertain to the evaluation of patients at risk for COVID-19 are in a state of rapid change based on information released by regulatory bodies including the CDC and federal and state organizations. These policies and algorithms were followed during the patient's care in the ED.  Some ED evaluations and interventions may be delayed as a result of limited staffing during the pandemic.   ____________________________________________   FINAL CLINICAL IMPRESSION(S) / ED DIAGNOSES   Final diagnoses:  Renal colic      NEW MEDICATIONS STARTED DURING THIS VISIT:  ED Discharge Orders         Ordered    tamsulosin (FLOMAX) 0.4 MG CAPS capsule  Daily     05/15/19 0517           Note:  This document was prepared using Dragon voice recognition software and may include unintentional dictation errors.    Don Perking, [place], MD 05/15/19 [phone]

## 2019-05-15 NOTE — ED Notes (Signed)
Bladder scan completed, trouble visualizing bladder. 50mL max recorded volume

## 2019-05-15 NOTE — ED Notes (Signed)
MD at bedside. 

## 2019-05-15 NOTE — ED Notes (Signed)
Per pt, last medications at 2330 were: Oxycodone "x1 pill" Zofran Unknown antibiotic

## 2019-05-15 NOTE — ED Notes (Signed)
Pt ringing call bell in subwait for po fluids. Pt informed that he should wait until he is seen by md for po fluids due to nature of why he is here. Pt states "after you leave i'll just drink out of the spicket".

## 2019-05-15 NOTE — ED Triage Notes (Signed)
Pt arrives from home via POV with complaint of vomiting blood earlier today and urinary retention this Friday, following diagnosis of an 14mm kidney stone.

## 2019-05-16 ENCOUNTER — Encounter: Payer: Self-pay | Admitting: Urology

## 2019-05-16 ENCOUNTER — Other Ambulatory Visit: Payer: Self-pay | Admitting: Radiology

## 2019-05-16 ENCOUNTER — Other Ambulatory Visit: Payer: Self-pay

## 2019-05-16 ENCOUNTER — Other Ambulatory Visit: Payer: Self-pay | Admitting: Urology

## 2019-05-16 ENCOUNTER — Ambulatory Visit
Admission: RE | Admit: 2019-05-16 | Discharge: 2019-05-16 | Disposition: A | Discharge status: Home / Self Care | Payer: Self-pay | Attending: Urology | Admitting: Urology

## 2019-05-16 ENCOUNTER — Ambulatory Visit (INDEPENDENT_AMBULATORY_CARE_PROVIDER_SITE_OTHER): Payer: Self-pay | Admitting: Urology

## 2019-05-16 ENCOUNTER — Ambulatory Visit
Admission: RE | Admit: 2019-05-16 | Discharge: 2019-05-16 | Disposition: A | Discharge status: Home / Self Care | Payer: Self-pay | Source: Ambulatory Visit | Attending: Urology | Admitting: Urology

## 2019-05-16 VITALS — BP 124/77 | HR 105 | Ht 67.0 in | Wt 220.0 lb

## 2019-05-16 DIAGNOSIS — N200 Calculus of kidney: Secondary | ICD-10-CM

## 2019-05-16 DIAGNOSIS — N201 Calculus of ureter: Secondary | ICD-10-CM

## 2019-05-16 LAB — URINE CULTURE: Culture: NO GROWTH

## 2019-05-16 MED ORDER — OXYCODONE-ACETAMINOPHEN 5-325 MG PO TABS: 1.0000 | ORAL_TABLET | Freq: Four times a day (QID) | ORAL | 0 refills | Status: AC | PRN

## 2019-05-16 NOTE — Progress Notes (Signed)
05/16/19 11:08 AM   Ray Robbins [birth year] [id number]  CC: Left 26mm ureteral stone  HPI: I saw Mr. Dinneen in urology clinic today for evaluation of left-sided flank pain secondary to an 8 mm left proximal ureteral stone.  He is a healthy 34 year old male with a history of spontaneously passed stones who presented to the ED on 05/13/2019 and 05/15/2019 with severe left-sided flank pain and nausea.  CT showed an 8 mm left proximal ureteral stone with upstream hydronephrosis.  There were no other renal stones.  Urinalysis showed no evidence of infection, and urine culture on 05/13/2019 showed <10k insignificant growth.  KUB today shows persistent left proximal ureteral stone.  His pain is better controlled today on narcotics.  He denies any fevers, chills, chest pain, or shortness of breath.   PMH: Past Medical History:  Diagnosis Date  . Kidney stones     Surgical History: No past surgical history on file.  Allergies: No Known Allergies  Family History: No family history on file.  Social History:  reports that he has been smoking cigarettes. He has never used smokeless tobacco. He reports current alcohol use. He reports current drug use. Drug: Marijuana.  ROS: Please see flowsheet from today's date for complete review of systems.  Physical Exam: BP 124/77   Pulse (!) 105   Ht 5\' 7"  (1.702 m)   Wt 220 lb (99.8 kg)   BMI 34.46 kg/m    Constitutional:  Alert and oriented, No acute distress. Cardiovascular: Regular rate and rhythm Respiratory: Clear to auscultation bilaterally GI: Abdomen is soft, nontender, nondistended, no abdominal masses GU: Left CVA tenderness Lymph: No cervical or inguinal lymphadenopathy. Skin: No rashes, bruises or suspicious lesions. Neurologic: Grossly intact, no focal deficits, moving all 4 extremities. Psychiatric: Normal mood and affect.  Laboratory Data: Reviewed, see HPI  Pertinent Imaging: I have personally reviewed the CT and KUB.   Stone clearly visible on KUB today over the left proximal ureter.  Stone measures 8 mm, 850HU, 13 cm skin to stone distance.  Assessment & Plan:   In summary, the patient is a 34 year old male with ongoing renal colic over the last week secondary to an 8 mm left proximal ureteral stone.  He has no clinical or laboratory signs of infection.  We discussed various treatment options for urolithiasis including observation with or without medical expulsive therapy, shockwave lithotripsy (SWL), ureteroscopy and laser lithotripsy with stent placement, and percutaneous nephrolithotomy.  We discussed that management is based on stone size, location, density, patient co-morbidities, and patient preference.   Stones <35mm in size have a >80% spontaneous passage rate. Data surrounding the use of tamsulosin for medical expulsive therapy is controversial, but meta analyses suggests it is most efficacious for distal stones between 5-45mm in size. Possible side effects include dizziness/lightheadedness, and retrograde ejaculation.  SWL has a lower stone free rate in a single procedure, but also a lower complication rate compared to ureteroscopy and avoids a stent and associated stent related symptoms. Possible complications include renal hematoma, steinstrasse, and need for additional treatment.  Ureteroscopy with laser lithotripsy and stent placement has a higher stone free rate than SWL in a single procedure, however increased complication rate including possible infection, ureteral injury, bleeding, and stent related morbidity. Common stent related symptoms include dysuria, urgency/frequency, and flank pain.  After an extensive discussion of the risks and benefits of the above treatment options, the patient would like to proceed with left shockwave lithotripsy  A total of 35  minutes were spent face-to-face with the patient, greater than 50% was spent in patient education, counseling, and coordination of care  regarding left ureteral stone and treatment options.   Billey Co, [facility] [address] [place], [place] [address] [phone]

## 2019-05-16 NOTE — Patient Instructions (Signed)
Lithotripsy  Lithotripsy is a treatment that can sometimes help eliminate kidney stones and the pain that they cause. A form of lithotripsy, also known as extracorporeal shock wave lithotripsy, is a nonsurgical procedure that crushes a kidney stone with shock waves. These shock waves pass through your body and focus on the kidney stone. They cause the kidney stone to break up while it is still in the urinary tract. This makes it easier for the smaller pieces of stone to pass in the urine. Tell a health care provider about:  Any allergies you have.  All medicines you are taking, including vitamins, herbs, eye drops, creams, and over-the-counter medicines.  Any blood disorders you have.  Any surgeries you have had.  Any medical conditions you have.  Whether you are pregnant or may be pregnant.  Any problems you or family members have had with anesthetic medicines. What are the risks? Generally, this is a safe procedure. However, problems may occur, including:  Infection.  Bleeding of the kidney.  Bruising of the kidney or skin.  Scarring of the kidney, which can lead to: ? Increased blood pressure. ? Poor kidney function. ? Return (recurrence) of kidney stones.  Damage to other structures or organs, such as the liver, colon, spleen, or pancreas.  Blockage (obstruction) of the the tube that carries urine from the kidney to the bladder (ureter).  Failure of the kidney stone to break into pieces (fragments). What happens before the procedure? Staying hydrated Follow instructions from your health care provider about hydration, which may include:  Up to 2 hours before the procedure - you may continue to drink clear liquids, such as water, clear fruit juice, black coffee, and plain tea. Eating and drinking restrictions Follow instructions from your health care provider about eating and drinking, which may include:  8 hours before the procedure - stop eating heavy meals or foods  such as meat, fried foods, or fatty foods.  6 hours before the procedure - stop eating light meals or foods, such as toast or cereal.  6 hours before the procedure - stop drinking milk or drinks that contain milk.  2 hours before the procedure - stop drinking clear liquids. General instructions  Plan to have someone take you home from the hospital or clinic.  Ask your health care provider about: ? Changing or stopping your regular medicines. This is especially important if you are taking diabetes medicines or blood thinners. ? Taking medicines such as aspirin and ibuprofen. These medicines and other NSAIDs can thin your blood. Do not take these medicines for 7 days before your procedure if your health care provider instructs you not to.  You may have tests, such as: ? Blood tests. ? Urine tests. ? Imaging tests, such as a CT scan. What happens during the procedure?  To lower your risk of infection: ? Your health care team will wash or sanitize their hands. ? Your skin will be washed with soap.  An IV tube will be inserted into one of your veins. This tube will give you fluids and medicines.  You will be given one or more of the following: ? A medicine to help you relax (sedative). ? A medicine to make you fall asleep (general anesthetic).  A water-filled cushion may be placed behind your kidney or on your abdomen. In some cases you may be placed in a tub of lukewarm water.  Your body will be positioned in a way that makes it easy to target the kidney   stone.  A flexible tube with holes in it (stent) may be placed in the ureter. This will help keep urine flowing from the kidney if the fragments of the stone have been blocking the ureter.  An X-ray or ultrasound exam will be done to locate your stone.  Shock waves will be aimed at the stone. If you are awake, you may feel a tapping sensation as the shock waves pass through your body. The procedure may vary among health care  providers and hospitals. What happens after the procedure?  You may have an X-ray to see whether the procedure was able to break up the kidney stone and how much of the stone has passed. If large stone fragments remain after treatment, you may need to have a second procedure at a later time.  Your blood pressure, heart rate, breathing rate, and blood oxygen level will be monitored until the medicines you were given have worn off.  You may be given antibiotics or pain medicine as needed.  If a stent was placed in your ureter during surgery, it may stay in place for a few weeks.  You may need strain your urine to collect pieces of the kidney stone for testing.  You will need to drink plenty of water.  Do not drive for 24 hours if you were given a sedative. Summary  Lithotripsy is a treatment that can sometimes help eliminate kidney stones and the pain that they cause.  A form of lithotripsy, also known as extracorporeal shock wave lithotripsy, is a nonsurgical procedure that crushes a kidney stone with shock waves.  Generally, this is a safe procedure. However, problems may occur, including damage to the kidney or other organs, infection, or obstruction of the tube that carries urine from the kidney to the bladder (ureter).  When you go home, you will need to drink plenty of water. You may be asked to strain your urine to collect pieces of the kidney stone for testing. This information is not intended to replace advice given to you by your health care provider. Make sure you discuss any questions you have with your health care provider. Document Revised: 07/26/2018 Document Reviewed: 03/05/2016 Elsevier Patient Education  2020 Elsevier Inc.    Lithotripsy, Care After This sheet gives you information about how to care for yourself after your procedure. Your health care provider may also give you more specific instructions. If you have problems or questions, contact your health care  provider. What can I expect after the procedure? After the procedure, it is common to have:  Some blood in your urine. This should only last for a few days.  Soreness in your back, sides, or upper abdomen for a few days.  Blotches or bruises on your back where the pressure wave entered the skin.  Pain, discomfort, or nausea when pieces (fragments) of the kidney stone move through the tube that carries urine from the kidney to the bladder (ureter). Stone fragments may pass soon after the procedure, but they may continue to pass for up to 4-8 weeks. ? If you have severe pain or nausea, contact your health care provider. This may be caused by a large stone that was not broken up, and this may mean that you need more treatment.  Some pain or discomfort during urination.  Some pain or discomfort in the lower abdomen or (in men) at the base of the penis. Follow these instructions at home: Medicines  Take over-the-counter and prescription medicines only as told   by your health care provider.  If you were prescribed an antibiotic medicine, take it as told by your health care provider. Do not stop taking the antibiotic even if you start to feel better.  Do not drive for 24 hours if you were given a medicine to help you relax (sedative).  Do not drive or use heavy machinery while taking prescription pain medicine. Eating and drinking      Drink enough water and fluids to keep your urine clear or pale yellow. This helps any remaining pieces of the stone to pass. It can also help prevent new stones from forming.  Eat plenty of fresh fruits and vegetables.  Follow instructions from your health care provider about eating and drinking restrictions. You may be instructed: ? To reduce how much salt (sodium) you eat or drink. Check ingredients and nutrition facts on packaged foods and beverages. ? To reduce how much meat you eat.  Eat the recommended amount of calcium for your age and gender. Ask  your health care provider how much calcium you should have. General instructions  Get plenty of rest.  Most people can resume normal activities 1-2 days after the procedure. Ask your health care provider what activities are safe for you.  Your health care provider may direct you to lie in a certain position (postural drainage) and tap firmly (percuss) over your kidney area to help stone fragments pass. Follow instructions as told by your health care provider.  If directed, strain all urine through the strainer that was provided by your health care provider. ? Keep all fragments for your health care provider to see. Any stones that are found may be sent to a medical lab for examination. The stone may be as small as a grain of salt.  Keep all follow-up visits as told by your health care provider. This is important. Contact a health care provider if:  You have pain that is severe or does not get better with medicine.  You have nausea that is severe or does not go away.  You have blood in your urine longer than your health care provider told you to expect.  You have more blood in your urine.  You have pain during urination that does not go away.  You urinate more frequently than usual and this does not go away.  You develop a rash or any other possible signs of an allergic reaction. Get help right away if:  You have severe pain in your back, sides, or upper abdomen.  You have severe pain while urinating.  Your urine is very dark red.  You have blood in your stool (feces).  You cannot pass any urine at all.  You feel a strong urge to urinate after emptying your bladder.  You have a fever or chills.  You develop shortness of breath, difficulty breathing, or chest pain.  You have severe nausea that leads to persistent vomiting.  You faint. Summary  After this procedure, it is common to have some pain, discomfort, or nausea when pieces (fragments) of the kidney stone move  through the tube that carries urine from the kidney to the bladder (ureter). If this pain or nausea is severe, however, you should contact your health care provider.  Most people can resume normal activities 1-2 days after the procedure. Ask your health care provider what activities are safe for you.  Drink enough water and fluids to keep your urine clear or pale yellow. This helps any remaining pieces of   the stone to pass, and it can help prevent new stones from forming.  If directed, strain your urine and keep all fragments for your health care provider to see. Fragments or stones may be as small as a grain of salt.  Get help right away if you have severe pain in your back, sides, or upper abdomen or have severe pain while urinating. This information is not intended to replace advice given to you by your health care provider. Make sure you discuss any questions you have with your health care provider. Document Revised: 07/26/2018 Document Reviewed: 03/05/2016 Elsevier Patient Education  2020 Elsevier Inc.  

## 2019-05-17 ENCOUNTER — Other Ambulatory Visit
Admission: RE | Admit: 2019-05-17 | Discharge: 2019-05-17 | Disposition: A | Discharge status: Home / Self Care | Payer: HRSA Program | Source: Ambulatory Visit | Attending: Urology | Admitting: Urology

## 2019-05-17 DIAGNOSIS — Z01812 Encounter for preprocedural laboratory examination: Secondary | ICD-10-CM | POA: Insufficient documentation

## 2019-05-17 DIAGNOSIS — Z20822 Contact with and (suspected) exposure to covid-19: Secondary | ICD-10-CM | POA: Diagnosis not present

## 2019-05-17 LAB — SARS CORONAVIRUS 2 (TAT 6-24 HRS): SARS Coronavirus 2: NEGATIVE

## 2019-05-18 ENCOUNTER — Other Ambulatory Visit: Payer: Self-pay | Admitting: Urology

## 2019-05-18 DIAGNOSIS — N201 Calculus of ureter: Secondary | ICD-10-CM

## 2019-05-18 LAB — CULTURE, BLOOD (ROUTINE X 2)
Culture: NO GROWTH
Culture: NO GROWTH
Specimen Description: ADEQUATE

## 2019-05-19 ENCOUNTER — Ambulatory Visit
Admission: RE | Admit: 2019-05-19 | Discharge: 2019-05-19 | Disposition: A | Discharge status: Home / Self Care | Payer: Self-pay | Source: Ambulatory Visit | Attending: Urology | Admitting: Urology

## 2019-05-19 ENCOUNTER — Ambulatory Visit: Payer: Self-pay

## 2019-05-19 ENCOUNTER — Encounter: Payer: Self-pay | Admitting: Urology

## 2019-05-19 ENCOUNTER — Encounter
Admission: RE | Disposition: A | Discharge status: Home / Self Care | Payer: Self-pay | Source: Ambulatory Visit | Attending: Urology

## 2019-05-19 ENCOUNTER — Other Ambulatory Visit: Payer: Self-pay

## 2019-05-19 DIAGNOSIS — Z87442 Personal history of urinary calculi: Secondary | ICD-10-CM | POA: Insufficient documentation

## 2019-05-19 DIAGNOSIS — N132 Hydronephrosis with renal and ureteral calculous obstruction: Secondary | ICD-10-CM | POA: Insufficient documentation

## 2019-05-19 DIAGNOSIS — N201 Calculus of ureter: Secondary | ICD-10-CM

## 2019-05-19 DIAGNOSIS — F17210 Nicotine dependence, cigarettes, uncomplicated: Secondary | ICD-10-CM | POA: Insufficient documentation

## 2019-05-19 HISTORY — PX: EXTRACORPOREAL SHOCK WAVE LITHOTRIPSY: SHX1557

## 2019-05-19 SURGERY — LITHOTRIPSY, ESWL
Anesthesia: Moderate Sedation | Laterality: Left

## 2019-05-19 MED ORDER — DIPHENHYDRAMINE HCL 25 MG PO CAPS
ORAL_CAPSULE | ORAL | Status: AC
  Administered 2019-05-19: 07:00:00 25 mg via ORAL
  Filled 2019-05-19: qty 1

## 2019-05-19 MED ORDER — CIPROFLOXACIN HCL 500 MG PO TABS
ORAL_TABLET | ORAL | Status: AC
  Administered 2019-05-19: 500 mg via ORAL
  Filled 2019-05-19: qty 1

## 2019-05-19 MED ORDER — ONDANSETRON HCL 4 MG/2ML IJ SOLN: 4.0000 mg | Freq: Once | INTRAMUSCULAR | Status: DC

## 2019-05-19 MED ORDER — DIPHENHYDRAMINE HCL 25 MG PO CAPS: 25.0000 mg | ORAL_CAPSULE | ORAL | Status: AC

## 2019-05-19 MED ORDER — SODIUM CHLORIDE 0.9 % IV SOLN: INTRAVENOUS | Status: DC

## 2019-05-19 MED ORDER — CIPROFLOXACIN HCL 500 MG PO TABS: 500.0000 mg | ORAL_TABLET | Freq: Once | ORAL | Status: AC

## 2019-05-19 MED ORDER — DIAZEPAM 5 MG PO TABS: 10.0000 mg | ORAL_TABLET | ORAL | Status: AC

## 2019-05-19 MED ORDER — ONDANSETRON HCL 4 MG/2ML IJ SOLN: 4.0000 mg | Freq: Once | INTRAMUSCULAR | Status: AC

## 2019-05-19 MED ORDER — OXYCODONE-ACETAMINOPHEN 5-325 MG PO TABS: 1.0000 | ORAL_TABLET | Freq: Four times a day (QID) | ORAL | 0 refills | Status: AC | PRN

## 2019-05-19 MED ORDER — ONDANSETRON HCL 4 MG/2ML IJ SOLN
INTRAMUSCULAR | Status: AC
  Administered 2019-05-19: 4 mg via INTRAVENOUS
  Filled 2019-05-19: qty 2

## 2019-05-19 MED ORDER — DIAZEPAM 5 MG PO TABS
ORAL_TABLET | ORAL | Status: AC
  Administered 2019-05-19: 10 mg via ORAL
  Filled 2019-05-19: qty 2

## 2019-05-19 MED ORDER — TAMSULOSIN HCL 0.4 MG PO CAPS: 0.4000 mg | ORAL_CAPSULE | Freq: Every day | ORAL | 0 refills | Status: AC

## 2019-05-19 NOTE — Brief Op Note (Signed)
05/19/2019  7:41 AM  PATIENT:  Ray Robbins  34 y.o. male  PRE-OPERATIVE DIAGNOSIS:  Left 74mm proximal ureteral stone  POST-OPERATIVE DIAGNOSIS: Same  PROCEDURE:  Procedure(s): EXTRACORPOREAL SHOCK WAVE LITHOTRIPSY (ESWL) (Left)  SURGEON:  Surgeon(s) and Role:    * Sondra Come, MD - Primary  EBL: None  Drains: None  Specimen: None  Findings:  1. On flouro stone appeared to completely disappear and have excellent fragmentation   PATIENT DISPOSITION:   Flomax, pain meds PRN, RTC 2 weeks KUB

## 2019-06-01 ENCOUNTER — Encounter: Payer: Self-pay | Admitting: Urology

## 2019-06-01 ENCOUNTER — Ambulatory Visit: Payer: Self-pay | Admitting: Urology

## 2019-06-02 ENCOUNTER — Ambulatory Visit: Payer: Self-pay | Admitting: Urology

## 2020-11-15 IMAGING — CT CT RENAL STONE PROTOCOL
2 of 4 series · 16 of 46 positions shown, 18 images · non-contrast
Comparison: CT dated 02/22/2016.

CLINICAL DATA: 33-year-old male with left flank pain.

EXAM:
CT ABDOMEN AND PELVIS WITHOUT CONTRAST
TECHNIQUE: Multidetector CT imaging of the abdomen and pelvis was performed
following the standard protocol without IV contrast.

[Series 2: stone full standard · axial · 0.83mm/px · z∈[-576,-121]mm · 13 of 101 slices shown, 15 images]
[im 5/101  soft-tissue]
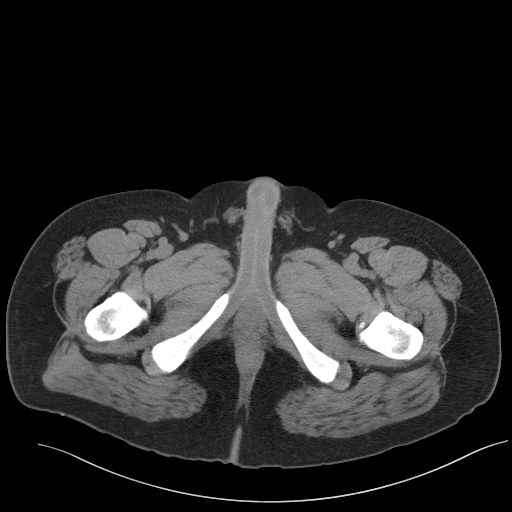
[im 5/101  bone]
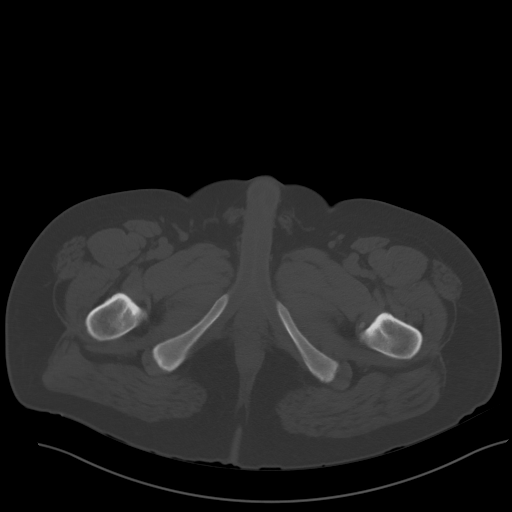
[im 13/101  soft-tissue]
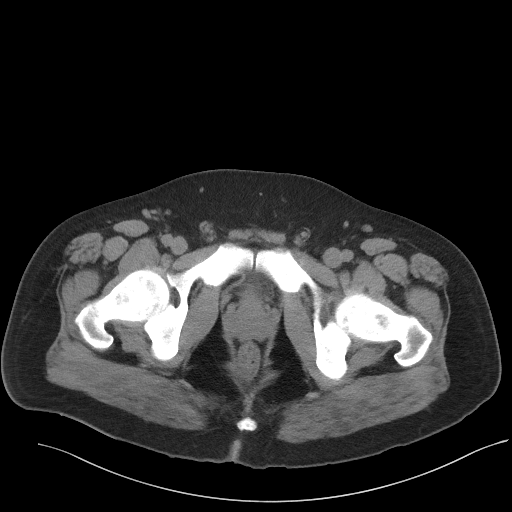
[im 21/101  soft-tissue]
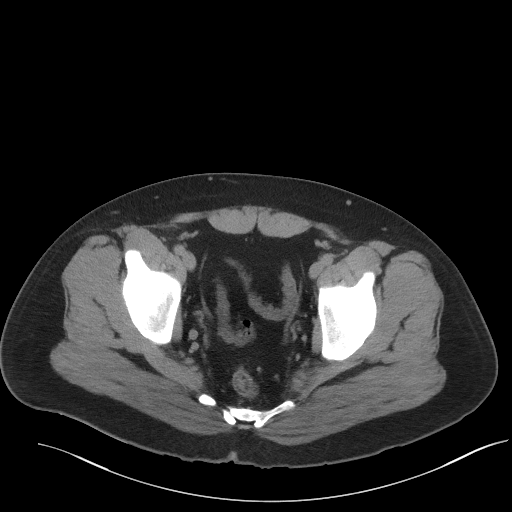
[im 30/101  soft-tissue]
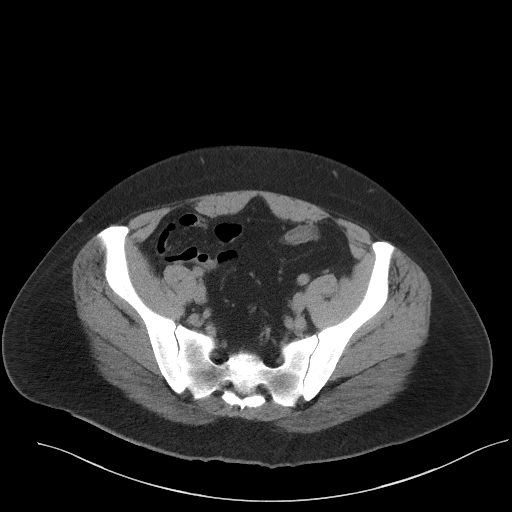
[im 34/101  soft-tissue]
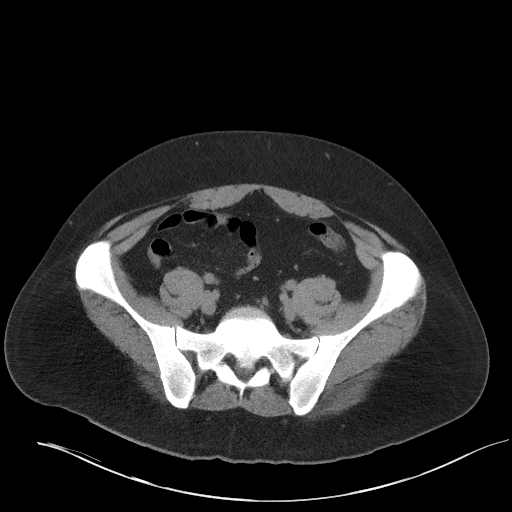
[im 42/101  soft-tissue]
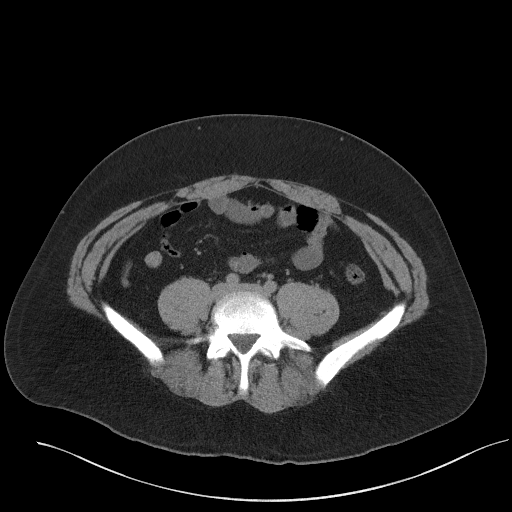
[im 51/101  soft-tissue]
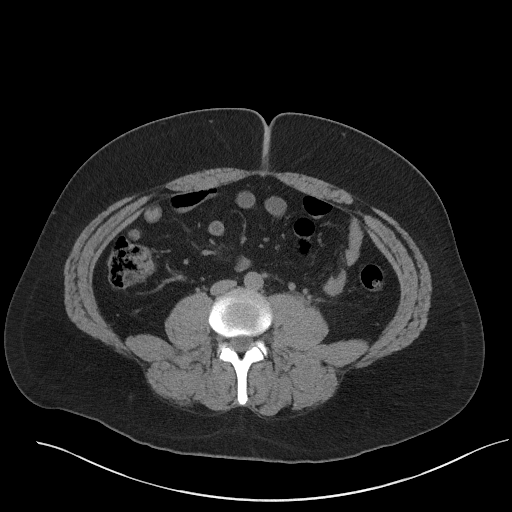
[im 59/101  soft-tissue]
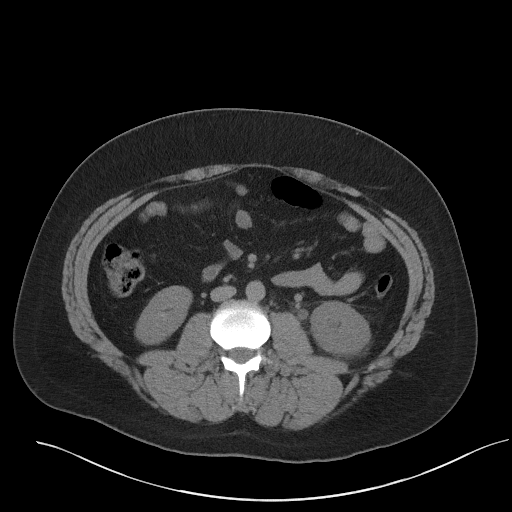
[im 67/101  soft-tissue]
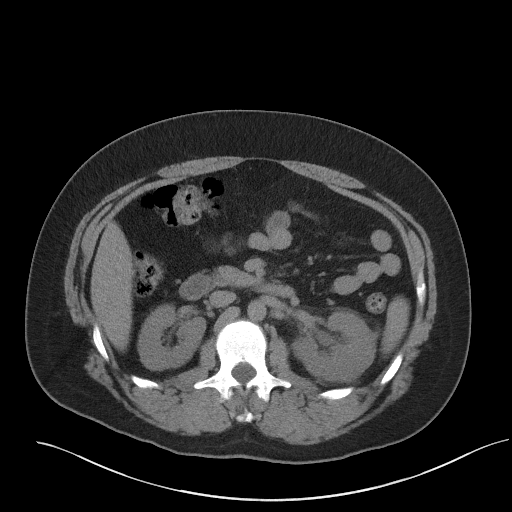
[im 67/101  bone]
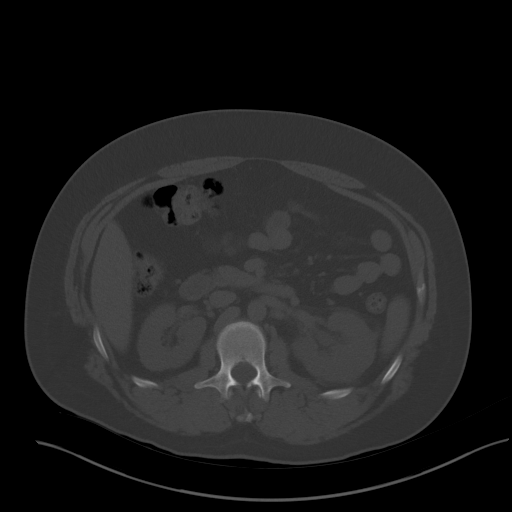
[im 71/101  soft-tissue]
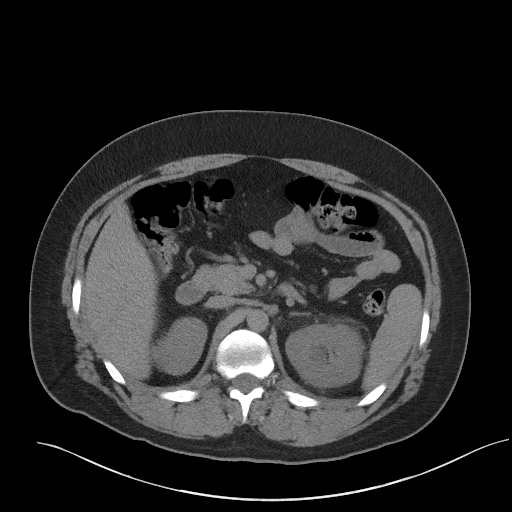
[im 80/101  soft-tissue]
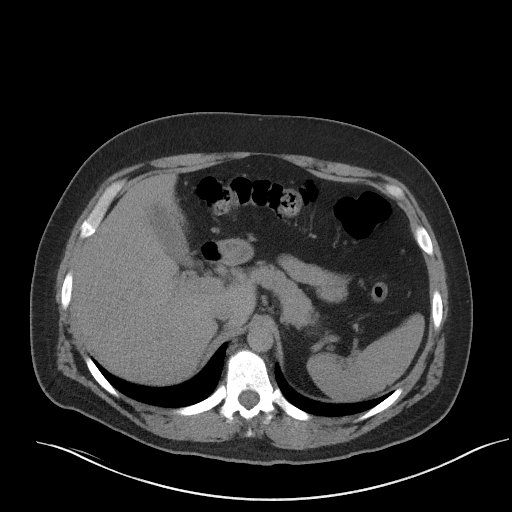
[im 88/101  soft-tissue]
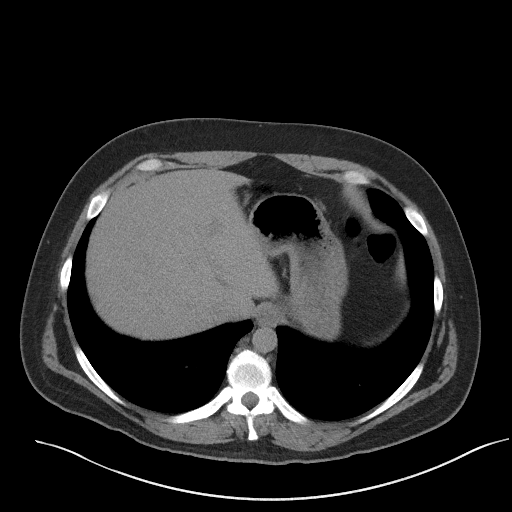
[im 96/101  soft-tissue]
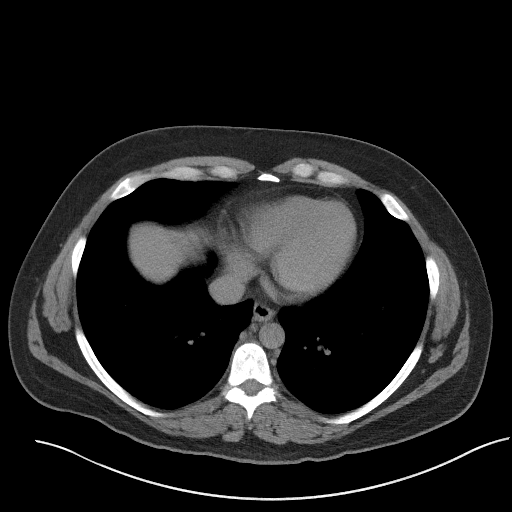

[Series 5: coronal · coronal · 0.75mm/px · 3 of 147 slices shown]
[im 49/147  soft-tissue]
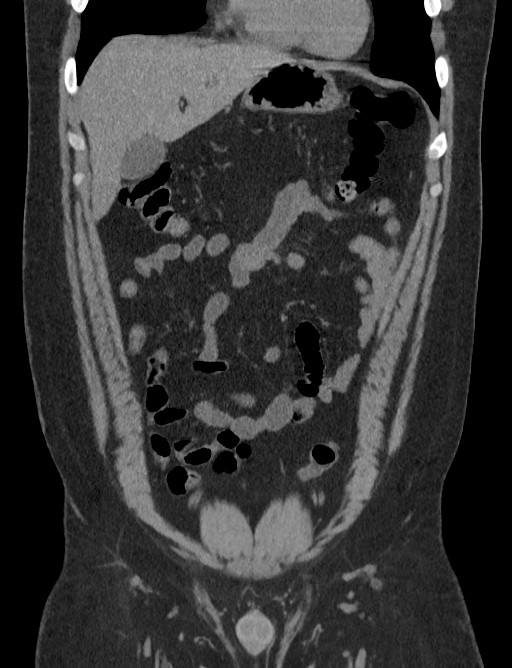
[im 65/147  soft-tissue]
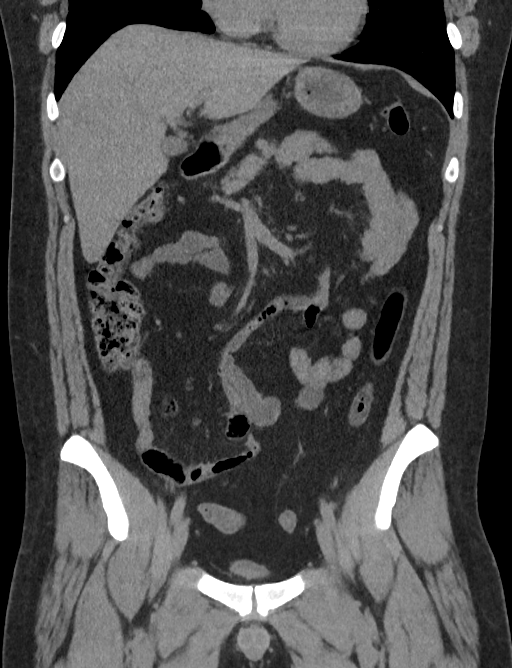
[im 82/147  soft-tissue]
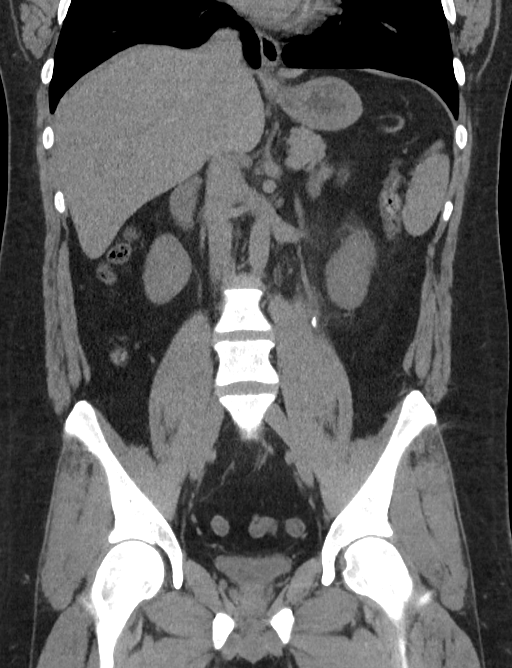

[16 of 46 positions shown; findings below may reference images not displayed]

FINDINGS: Evaluation of this exam is limited in the absence of intravenous
contrast.

Lower chest: The visualized lung bases are clear.

No intra-abdominal free air or free fluid.

Hepatobiliary: Diffuse fatty infiltration of the liver. No
intrahepatic biliary ductal dilatation. The gallbladder is
unremarkable.

Pancreas: Unremarkable. No pancreatic ductal dilatation or
surrounding inflammatory changes.

Spleen: Normal in size without focal abnormality.

Adrenals/Urinary Tract: The adrenal glands are unremarkable. There
is an 8 mm stone in the proximal left ureter with mild left
hydronephrosis. There is mild stranding of the left perinephric fat.
Correlation with urinalysis recommended to exclude superimposed UTI.
The right kidney, right ureter, and urinary bladder appear
unremarkable.

Stomach/Bowel: There is no bowel obstruction or active inflammation.
The appendix is normal.

Vascular/Lymphatic: The abdominal aorta and IVC are unremarkable. No
portal venous gas. There is no adenopathy.

Reproductive: The prostate and seminal vesicles are grossly
unremarkable. No pelvic mass

Other: None

Musculoskeletal: No acute or significant osseous findings.
IMPRESSION: An 8 mm left proximal ureteral calculus with mild left
hydronephrosis. Correlation with urinalysis recommended to exclude
superimposed UTI.

## 2020-11-18 IMAGING — CR DG ABDOMEN 1V
1 series · 2 of 2 positions shown · non-contrast
Comparison: CT scan May 13, 2019

CLINICAL DATA: Follow-up left renal stone.

EXAM:
ABDOMEN - 1 VIEW

[Series 1: dg abd 1 view · 0.14mm/px · 2 of 2 slices shown]
[im 1/2]
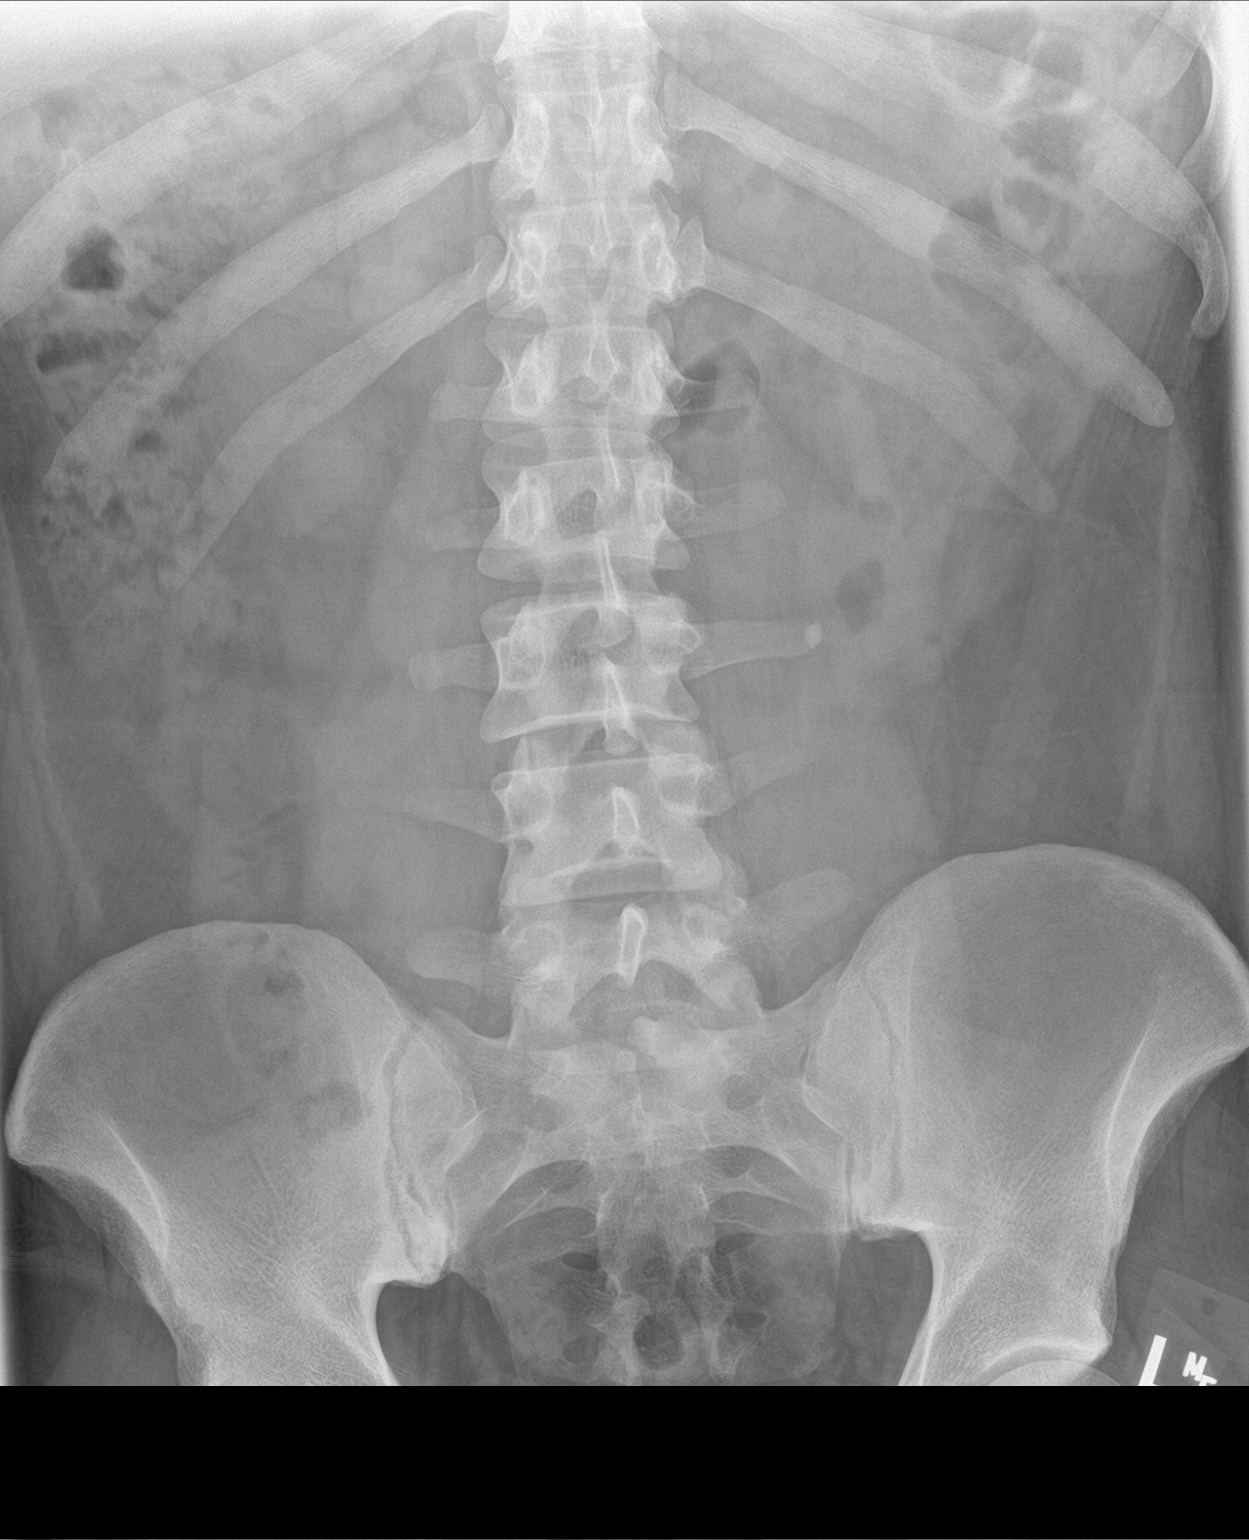
[im 2/2]
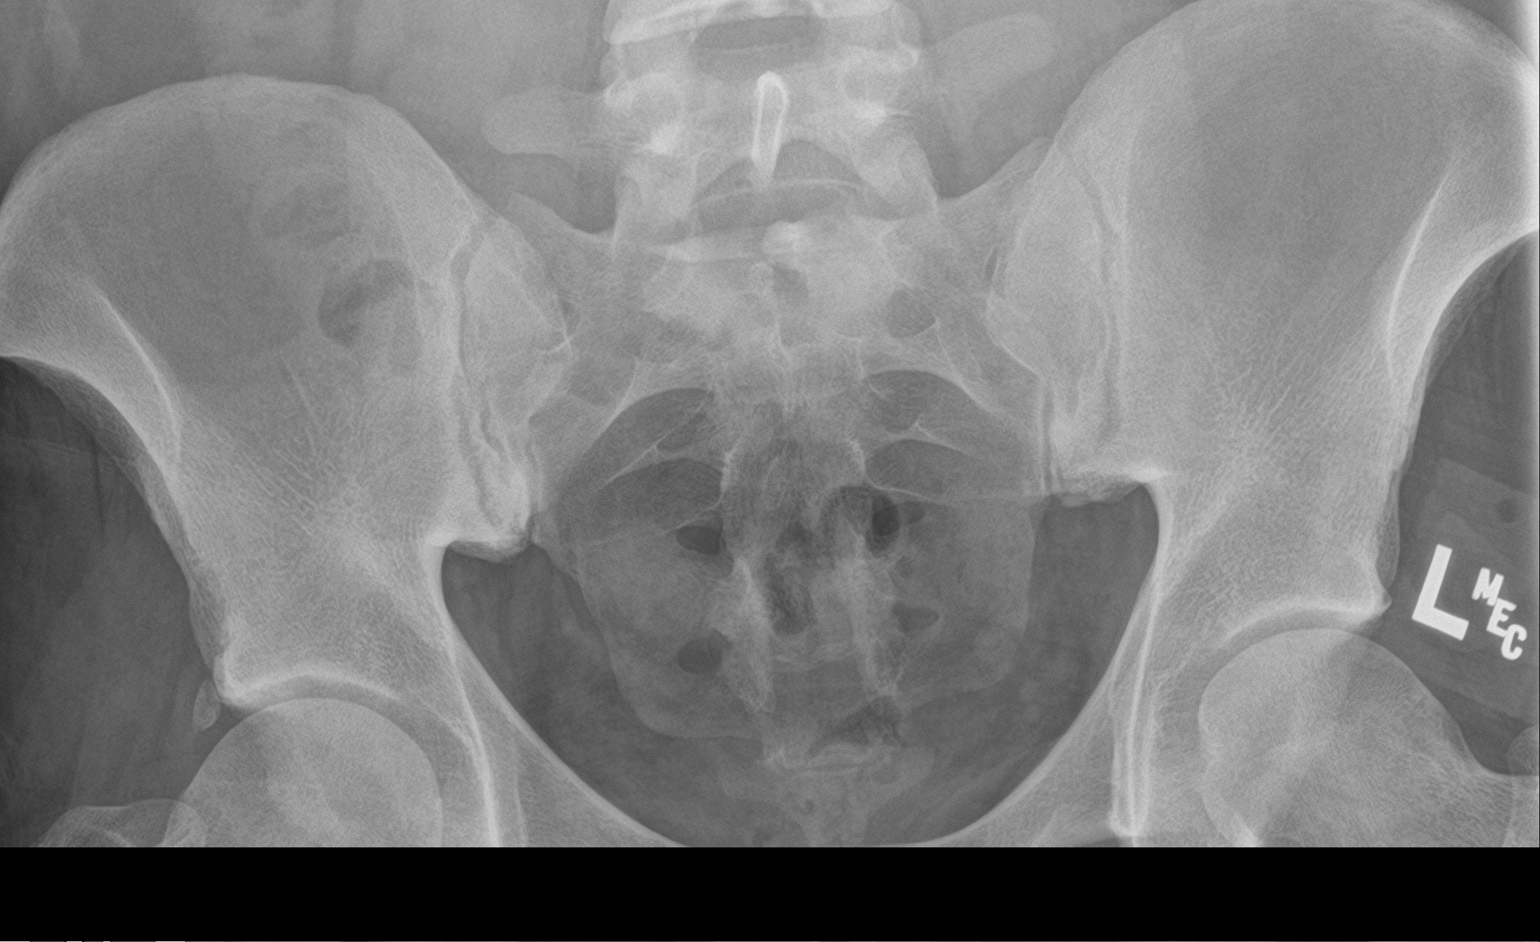

[2 of 2 positions shown; findings below may reference images not displayed]

FINDINGS: There is a stone at the level of L3 measuring 6 mm in greatest
dimension. The stone projects over the peripheral tip of the left L3
transverse process. There is not been significant migration in the
interval. No other renal or ureteral stones are identified. No other
acute abnormalities.
IMPRESSION: There is a 6 mm stone in the left ureter at the level of L3, not
significantly changed since May 13, 2019.

## 2020-11-21 IMAGING — CR DG ABDOMEN 1V
1 series · 2 of 2 positions shown · non-contrast
Comparison: 05/16/2019

CLINICAL DATA: Left ureteral calculus.

EXAM:
ABDOMEN - 1 VIEW

[Series 1: dg abd 1 view · 0.14mm/px · 2 of 2 slices shown]
[im 1/2]
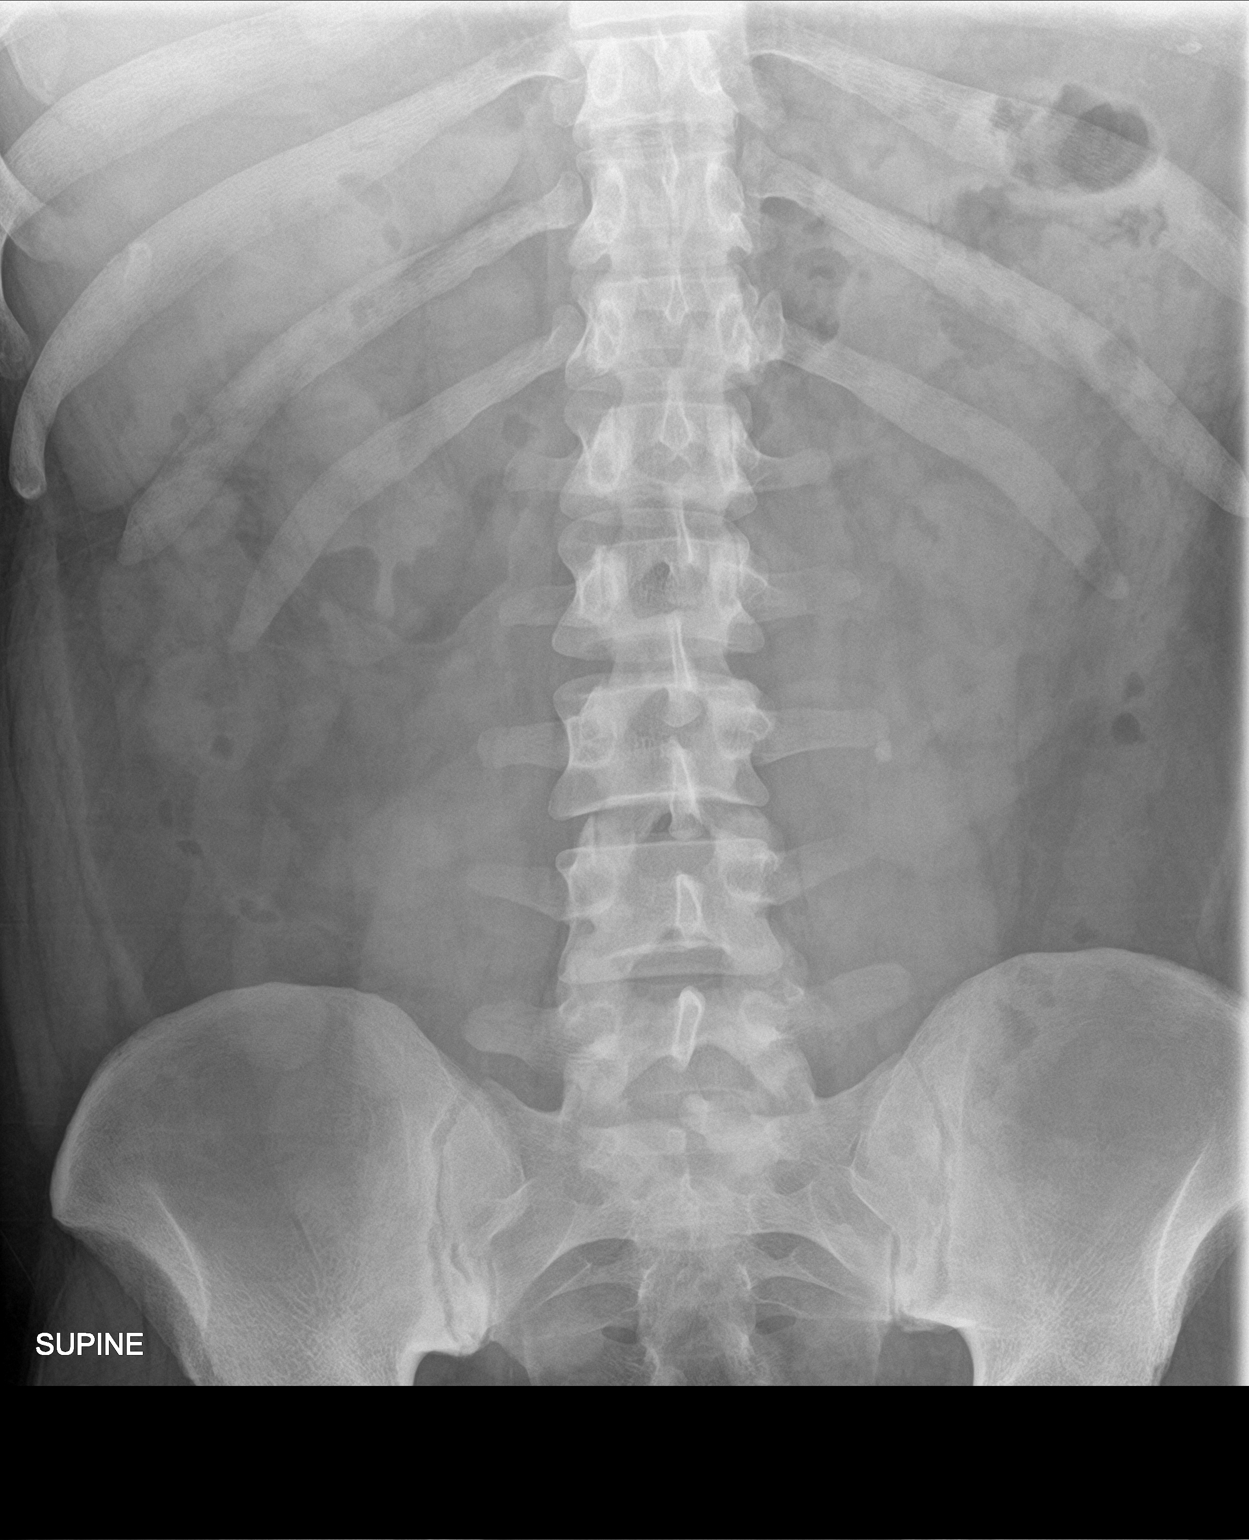
[im 2/2]
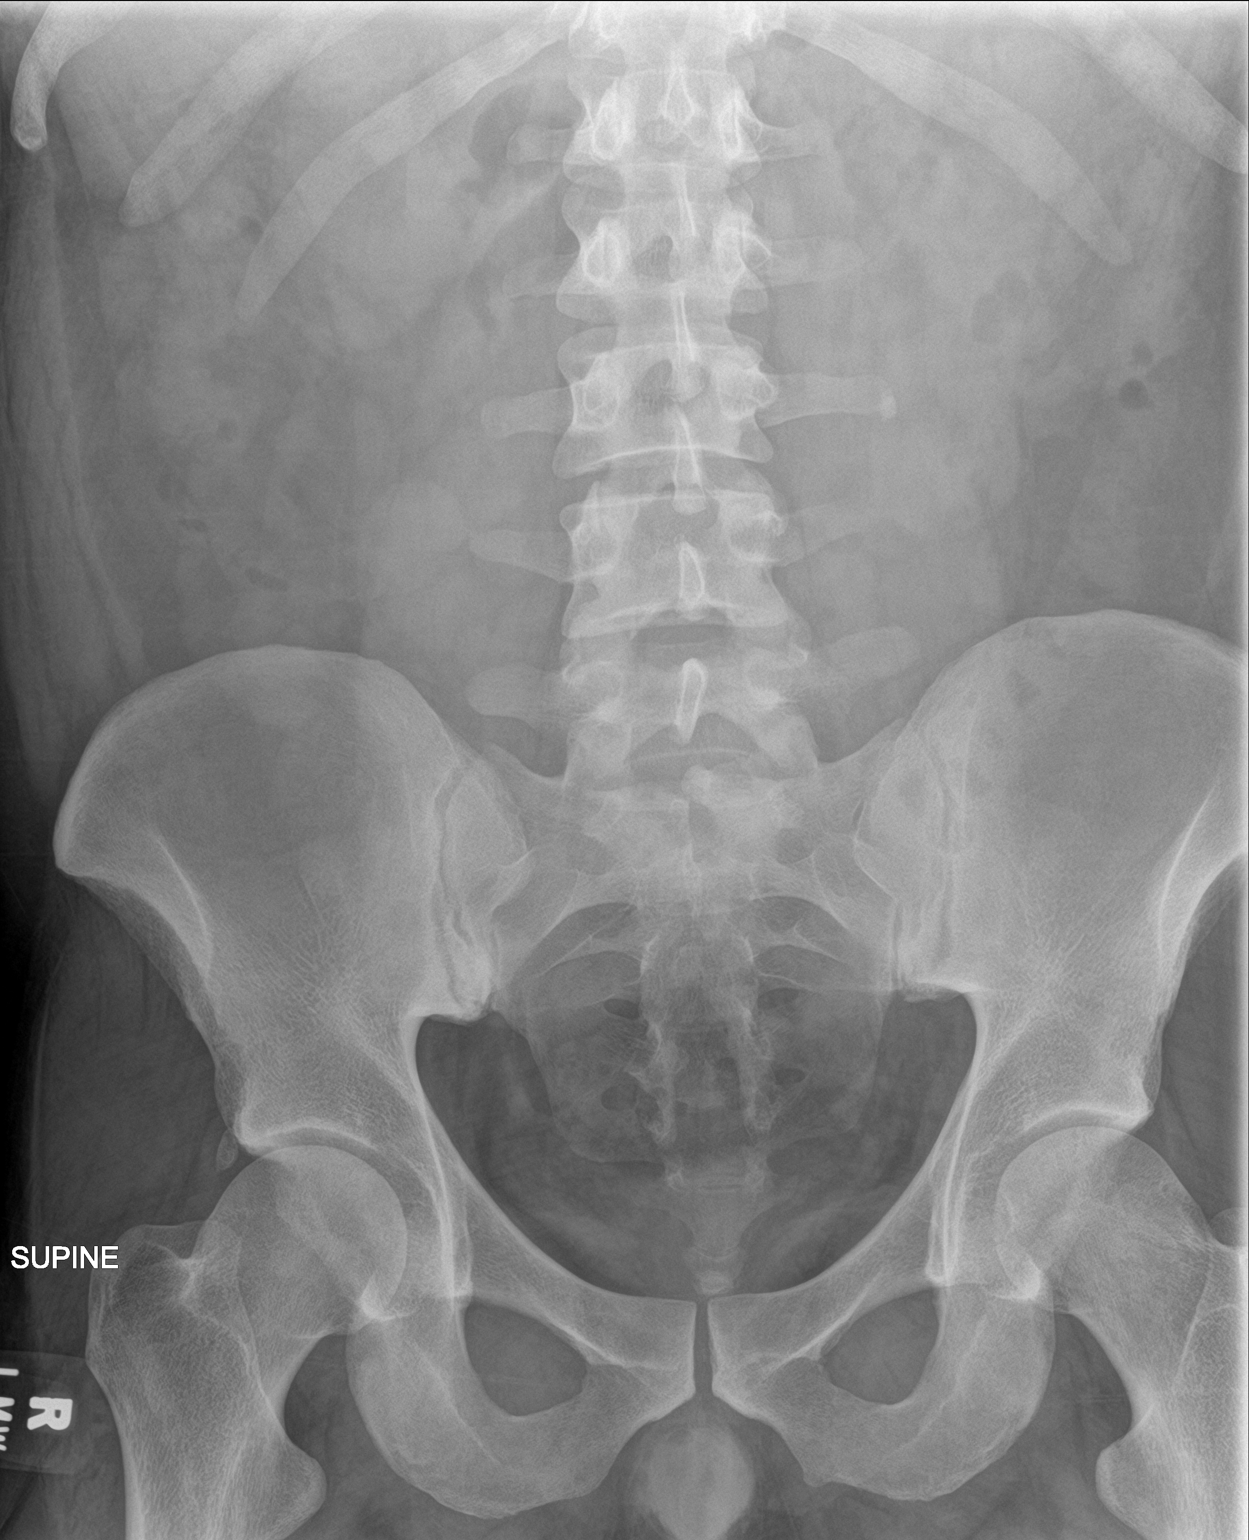

[2 of 2 positions shown; findings below may reference images not displayed]

FINDINGS: The bowel gas pattern is normal. 6 mm calculus is again seen
overlying the proximal left ureter at the level of the L3 transverse
process. This is not significantly changed in position since prior
study. No other radiopaque calculi are identified.
IMPRESSION: 6 mm proximal left ureteral calculus, without significant change in
position.

## 2021-11-08 ENCOUNTER — Other Ambulatory Visit: Payer: Self-pay

## 2021-11-08 ENCOUNTER — Encounter: Payer: Self-pay | Admitting: *Deleted

## 2021-11-08 DIAGNOSIS — F12188 Cannabis abuse with other cannabis-induced disorder: Secondary | ICD-10-CM | POA: Insufficient documentation

## 2021-11-08 DIAGNOSIS — R42 Dizziness and giddiness: Secondary | ICD-10-CM | POA: Diagnosis not present

## 2021-11-08 DIAGNOSIS — E860 Dehydration: Secondary | ICD-10-CM | POA: Diagnosis not present

## 2021-11-08 DIAGNOSIS — D72829 Elevated white blood cell count, unspecified: Secondary | ICD-10-CM | POA: Diagnosis not present

## 2021-11-08 DIAGNOSIS — R824 Acetonuria: Secondary | ICD-10-CM | POA: Insufficient documentation

## 2021-11-08 DIAGNOSIS — R197 Diarrhea, unspecified: Secondary | ICD-10-CM | POA: Insufficient documentation

## 2021-11-08 DIAGNOSIS — R112 Nausea with vomiting, unspecified: Secondary | ICD-10-CM | POA: Diagnosis present

## 2021-11-08 DIAGNOSIS — R55 Syncope and collapse: Secondary | ICD-10-CM | POA: Diagnosis not present

## 2021-11-08 LAB — URINALYSIS, ROUTINE W REFLEX MICROSCOPIC
Bilirubin Urine: NEGATIVE
Glucose, UA: NEGATIVE mg/dL
Hgb urine dipstick: NEGATIVE
Ketones, ur: 5 mg/dL — AB
Leukocytes,Ua: NEGATIVE
Nitrite: NEGATIVE
Protein, ur: 30 mg/dL — AB
Specific Gravity, Urine: 1.018 (ref 1.005–1.030)
pH: 5 (ref 5.0–8.0)

## 2021-11-08 LAB — COMPREHENSIVE METABOLIC PANEL
ALT: 24 U/L (ref 0–44)
AST: 18 U/L (ref 15–41)
Albumin: 4.9 g/dL (ref 3.5–5.0)
Alkaline Phosphatase: 80 U/L (ref 38–126)
Anion gap: 11 (ref 5–15)
BUN: 13 mg/dL (ref 6–20)
CO2: 22 mmol/L (ref 22–32)
Calcium: 9.9 mg/dL (ref 8.9–10.3)
Chloride: 104 mmol/L (ref 98–111)
Creatinine, Ser: 1.01 mg/dL (ref 0.61–1.24)
GFR, Estimated: >60 mL/min (ref >60)
Glucose, Bld: 96 mg/dL (ref 70–99)
Potassium: 3.8 mmol/L (ref 3.5–5.1)
Sodium: 137 mmol/L (ref 135–145)
Total Bilirubin: 0.9 mg/dL (ref 0.3–1.2)
Total Protein: 7.7 g/dL (ref 6.5–8.1)

## 2021-11-08 LAB — CBC
HCT: 48.2 % (ref 39.0–52.0)
Hemoglobin: 16.3 g/dL (ref 13.0–17.0)
MCH: 28.5 pg (ref 26.0–34.0)
MCHC: 33.8 g/dL (ref 30.0–36.0)
MCV: 84.3 fL (ref 80.0–100.0)
Platelets: 326 10*3/uL (ref 150–400)
RBC: 5.72 MIL/uL (ref 4.22–5.81)
RDW: 12.8 % (ref 11.5–15.5)
WBC: 17.4 10*3/uL — ABNORMAL HIGH (ref 4.0–10.5)
nRBC: 0 % (ref 0.0–0.2)

## 2021-11-08 LAB — LIPASE, BLOOD: Lipase: 23 U/L (ref 11–51)

## 2021-11-08 NOTE — ED Triage Notes (Signed)
Pt states for about 2 weeks he has had some vomiting (up to 4-5 times a day) with some diarrhea. 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 Only abdominal pain that occurs is when he has the vomiting.

## 2021-11-09 ENCOUNTER — Emergency Department
Admission: EM | Admit: 2021-11-09 | Discharge: 2021-11-09 | Disposition: A | Discharge status: Home / Self Care | Payer: BC Managed Care – PPO | Attending: Emergency Medicine | Admitting: Emergency Medicine

## 2021-11-09 DIAGNOSIS — R55 Syncope and collapse: Secondary | ICD-10-CM

## 2021-11-09 DIAGNOSIS — R112 Nausea with vomiting, unspecified: Secondary | ICD-10-CM

## 2021-11-09 DIAGNOSIS — E860 Dehydration: Secondary | ICD-10-CM

## 2021-11-09 DIAGNOSIS — F12188 Cannabis abuse with other cannabis-induced disorder: Secondary | ICD-10-CM

## 2021-11-09 MED ORDER — ONDANSETRON 4 MG PO TBDP: 4.0000 mg | ORAL_TABLET | Freq: Three times a day (TID) | ORAL | 0 refills | Status: AC | PRN

## 2021-11-09 NOTE — Discharge Instructions (Addendum)
Consider taking a generic multivitamin every day.  Hot showers can help with feeling nauseous and throwing up in the setting of smoking cannabis.  Stopping smoking altogether would likely prevent the episodes of throwing up you are frequently experiencing.  If you develop any severe abdominal pain, fevers, passing out all the way or any worsening symptoms please return to the ED.

## 2021-11-09 NOTE — ED Provider Notes (Signed)
[facility] Provider Note    Event Date/Time   First MD Initiated Contact with Patient 11/09/21 0059     (approximate)   History   Emesis   HPI  Ray Robbins is a 36 y.o. male who presents to the ED for evaluation of Emesis   I review 2021 urology procedure for ureteral stone.  Shockwave lithotripsy performed.  Patient presents to the ED for evaluation of 2 months of nausea, emesis, diarrhea and episodes of presyncope while at work.  He reports primary concern that at his workplace he sometimes develops episodes of presyncope, vision narrowing and blurriness throughout his visual field, improved with rest and sedentary/supine positioning.  Denies any syncopal episodes, focal visual defects or weakness to the extremities.  He reports he works with [employer] and is concerned that he might harm somebody in the setting of his possible passing out.  Patient reports 2 months ago he cut meat out of his diet and has been trying to focus on plant-based materials.  Further reports smoking cannabis on a daily basis.  He denies any primary abdominal pain, sometimes reports soreness to his bilateral sides in the setting of active emesis.  Denies symptoms concerning for any ureteral stones.  Reports he felt dizzy this evening at work so he presents this evening because he is "tired of it and concerned something might happen."  He waits a couple hours prior to my evaluation, reports eating and drinking something without any postprandial symptoms and feeling better now.  He is now asymptomatic.   Physical Exam   Triage Vital Signs: ED Triage Vitals  Enc Vitals Group     BP 11/08/21 2124 (!) 128/94     Pulse Rate 11/08/21 2120 84     Resp 11/08/21 2120 20     Temp 11/08/21 2120 98.6 F (37 C)     Temp Source 11/08/21 2120 Oral     SpO2 11/08/21 2120 99 %     Weight 11/08/21 2124 200 lb (90.7 kg)     Height 11/08/21 2124 5\' 7"  (1.702 m)     Head  Circumference --      Peak Flow --      Pain Score 11/08/21 2124 1     Pain Loc --      Pain Edu? --      Excl. in GC? --     Most recent vital signs: Vitals:   11/08/21 2120 11/08/21 2124  BP:  (!) 128/94  Pulse: 84   Resp: 20   Temp: 98.6 F (37 C)   SpO2: 99%     General: Awake, no distress.  Pleasant and conversational.  Well-appearing.  Ambulatory with normal gait. CV:  Good peripheral perfusion.  Resp:  Normal effort.  Abd:  No distention.  Soft and benign throughout without any tenderness.  No CVA tenderness. MSK:  No deformity noted.  Neuro:  No focal deficits appreciated. Cranial nerves II through XII intact 5/5 strength and sensation in all 4 extremities Other:     ED Results / Procedures / Treatments   Labs (all labs ordered are listed, but only abnormal results are displayed) Labs Reviewed  CBC - Abnormal; Notable for the following components:      Result Value   WBC 17.4 (*)    All other components within normal limits  URINALYSIS, ROUTINE W REFLEX MICROSCOPIC - Abnormal; Notable for the following components:   Color, Urine YELLOW (*)  APPearance HAZY (*)    Ketones, ur 5 (*)    Protein, ur 30 (*)    Bacteria, UA RARE (*)    All other components within normal limits  LIPASE, BLOOD  COMPREHENSIVE METABOLIC PANEL    EKG   RADIOLOGY   Official radiology report(s): No results found.  PROCEDURES and INTERVENTIONS:  Procedures  Medications - No data to display   IMPRESSION / MDM / ASSESSMENT AND PLAN / ED COURSE  I reviewed the triage vital signs and the nursing notes.  Differential diagnosis includes, but is not limited to, dehydration, stroke, peripheral vertigo, sepsis, acute cystitis, pancreatitis  {Patient presents with symptoms of an acute illness or injury that is potentially life-threatening.  Pleasant 36 year old male presents to the ED with recurrent episodic presyncopal dizziness in the setting of dietary changes and regular  cannabis abuse, I suspect with an underlying cannabis hyperemesis precipitating dehydration and vasovagal episodes.  He looks systemically well and is asymptomatic after p.o. ingestion prior to my evaluation.  Blood work with no metabolic derangements and normal lipase.  Urine without infectious features, but with ketones suggestive of dehydration.  CBC does have a leukocytosis that is nonspecific.  I doubt infectious etiology of his symptoms or sepsis based off of a reassuring examination and other diagnostics.  We discussed my recommendation for abstinence of cannabis, as needed ondansetron and ensuring p.o. hydration in the setting of his dietary changes.  We discussed return precautions and he is suitable for outpatient management.      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   Delton [place], MD 11/09/21 [phone]
